# Patient Record
Sex: Female | Born: 1945 | Race: White | Hispanic: No | Smoking: Never smoker
Health system: Southern US, Community
[De-identification: ages and names within clinical notes are randomized; demographics above are authoritative.]

## PROBLEM LIST (undated history)

## (undated) DIAGNOSIS — E039 Hypothyroidism, unspecified: Secondary | ICD-10-CM

## (undated) DIAGNOSIS — I2699 Other pulmonary embolism without acute cor pulmonale: Secondary | ICD-10-CM

## (undated) DIAGNOSIS — Z3490 Encounter for supervision of normal pregnancy, unspecified, unspecified trimester: Secondary | ICD-10-CM

## (undated) DIAGNOSIS — K8020 Calculus of gallbladder without cholecystitis without obstruction: Secondary | ICD-10-CM

## (undated) DIAGNOSIS — E785 Hyperlipidemia, unspecified: Secondary | ICD-10-CM

## (undated) DIAGNOSIS — E119 Type 2 diabetes mellitus without complications: Secondary | ICD-10-CM

## (undated) DIAGNOSIS — R112 Nausea with vomiting, unspecified: Secondary | ICD-10-CM

## (undated) DIAGNOSIS — Z9889 Other specified postprocedural states: Secondary | ICD-10-CM

## (undated) DIAGNOSIS — I10 Essential (primary) hypertension: Secondary | ICD-10-CM

## (undated) DIAGNOSIS — J189 Pneumonia, unspecified organism: Secondary | ICD-10-CM

## (undated) HISTORY — PX: TUMOR EXCISION: SHX421

## (undated) HISTORY — PX: TUMOR REMOVAL: SHX12

## (undated) HISTORY — PX: COLONOSCOPY W/ POLYPECTOMY: SHX1380

## (undated) HISTORY — PX: SKIN CANCER EXCISION: SHX779

---

## 2004-04-26 ENCOUNTER — Other Ambulatory Visit: Admission: RE | Admit: 2004-04-26 | Discharge: 2004-04-26 | Payer: Self-pay | Admitting: Family Medicine

## 2004-08-08 ENCOUNTER — Encounter (INDEPENDENT_AMBULATORY_CARE_PROVIDER_SITE_OTHER): Payer: Self-pay | Admitting: *Deleted

## 2004-08-08 ENCOUNTER — Ambulatory Visit (HOSPITAL_COMMUNITY): Admission: RE | Admit: 2004-08-08 | Discharge: 2004-08-08 | Payer: Self-pay | Admitting: Gastroenterology

## 2006-11-15 ENCOUNTER — Emergency Department (HOSPITAL_COMMUNITY): Admission: EM | Admit: 2006-11-15 | Discharge: 2006-11-15 | Payer: Self-pay | Admitting: Emergency Medicine

## 2007-09-23 ENCOUNTER — Other Ambulatory Visit: Admission: RE | Admit: 2007-09-23 | Discharge: 2007-09-23 | Payer: Self-pay | Admitting: Family Medicine

## 2008-03-31 ENCOUNTER — Encounter: Admission: RE | Admit: 2008-03-31 | Discharge: 2008-03-31 | Payer: Self-pay | Admitting: Family Medicine

## 2008-06-01 ENCOUNTER — Ambulatory Visit (HOSPITAL_COMMUNITY): Admission: RE | Admit: 2008-06-01 | Discharge: 2008-06-01 | Payer: Self-pay | Admitting: Ophthalmology

## 2008-08-07 DIAGNOSIS — I2699 Other pulmonary embolism without acute cor pulmonale: Secondary | ICD-10-CM

## 2008-08-07 HISTORY — DX: Other pulmonary embolism without acute cor pulmonale: I26.99

## 2008-08-11 ENCOUNTER — Encounter (INDEPENDENT_AMBULATORY_CARE_PROVIDER_SITE_OTHER): Payer: Self-pay | Admitting: Internal Medicine

## 2008-08-11 ENCOUNTER — Inpatient Hospital Stay (HOSPITAL_COMMUNITY): Admission: EM | Admit: 2008-08-11 | Discharge: 2008-08-14 | Payer: Self-pay | Admitting: Emergency Medicine

## 2010-01-28 ENCOUNTER — Encounter: Payer: Self-pay | Admitting: Family Medicine

## 2010-04-15 LAB — BLOOD GAS, ARTERIAL
Bicarbonate: 26.3 mEq/L — ABNORMAL HIGH (ref 20.0–24.0)
O2 Saturation: 66.8 %
Patient temperature: 98.6
TCO2: 24.2 mmol/L (ref 0–100)
pH, Arterial: 7.422 — ABNORMAL HIGH (ref 7.350–7.400)
pO2, Arterial: 34.9 mmHg — CL (ref 80.0–100.0)

## 2010-04-15 LAB — DIFFERENTIAL
Basophils Absolute: 0 10*3/uL (ref 0.0–0.1)
Basophils Relative: 0 % (ref 0–1)
Eosinophils Absolute: 0 10*3/uL (ref 0.0–0.7)
Eosinophils Relative: 0 % (ref 0–5)
Lymphocytes Relative: 12 % (ref 12–46)
Lymphs Abs: 1 10*3/uL (ref 0.7–4.0)
Monocytes Absolute: 0.8 10*3/uL (ref 0.1–1.0)
Monocytes Relative: 10 % (ref 3–12)
Neutro Abs: 6.2 10*3/uL (ref 1.7–7.7)
Neutrophils Relative %: 77 % (ref 43–77)

## 2010-04-15 LAB — URINE CULTURE: Colony Count: >=100000

## 2010-04-15 LAB — HEMOGLOBIN A1C: Hgb A1c MFr Bld: 7.1 % — ABNORMAL HIGH (ref 4.6–6.1)

## 2010-04-15 LAB — BASIC METABOLIC PANEL
BUN: 15 mg/dL (ref 6–23)
CO2: 28 mEq/L (ref 19–32)
Calcium: 8.5 mg/dL (ref 8.4–10.5)
Calcium: 8.5 mg/dL (ref 8.4–10.5)
Chloride: 95 mEq/L — ABNORMAL LOW (ref 96–112)
Creatinine, Ser: 0.69 mg/dL (ref 0.4–1.2)
GFR calc Af Amer: >60 mL/min (ref >60)
GFR calc non Af Amer: 51 mL/min — ABNORMAL LOW (ref >60)
GFR calc non Af Amer: >60 mL/min (ref >60)
Glucose, Bld: 137 mg/dL — ABNORMAL HIGH (ref 70–99)
Sodium: 135 mEq/L (ref 135–145)

## 2010-04-15 LAB — GLUCOSE, CAPILLARY
Glucose-Capillary: 102 mg/dL — ABNORMAL HIGH (ref 70–99)
Glucose-Capillary: 141 mg/dL — ABNORMAL HIGH (ref 70–99)
Glucose-Capillary: 145 mg/dL — ABNORMAL HIGH (ref 70–99)
Glucose-Capillary: 149 mg/dL — ABNORMAL HIGH (ref 70–99)
Glucose-Capillary: 150 mg/dL — ABNORMAL HIGH (ref 70–99)
Glucose-Capillary: 156 mg/dL — ABNORMAL HIGH (ref 70–99)
Glucose-Capillary: 177 mg/dL — ABNORMAL HIGH (ref 70–99)
Glucose-Capillary: 187 mg/dL — ABNORMAL HIGH (ref 70–99)
Glucose-Capillary: 195 mg/dL — ABNORMAL HIGH (ref 70–99)
Glucose-Capillary: 211 mg/dL — ABNORMAL HIGH (ref 70–99)
Glucose-Capillary: 211 mg/dL — ABNORMAL HIGH (ref 70–99)
Glucose-Capillary: 220 mg/dL — ABNORMAL HIGH (ref 70–99)
Glucose-Capillary: 237 mg/dL — ABNORMAL HIGH (ref 70–99)

## 2010-04-15 LAB — POCT I-STAT, CHEM 8
BUN: 12 mg/dL (ref 6–23)
Calcium, Ion: 1.1 mmol/L — ABNORMAL LOW (ref 1.12–1.32)
Chloride: 95 meq/L — ABNORMAL LOW (ref 96–112)
Creatinine, Ser: 1.2 mg/dL (ref 0.4–1.2)
Glucose, Bld: 174 mg/dL — ABNORMAL HIGH (ref 70–99)
HCT: 36 % (ref 36.0–46.0)
Hemoglobin: 12.2 g/dL (ref 12.0–15.0)
Potassium: 3.8 meq/L (ref 3.5–5.1)
Sodium: 131 meq/L — ABNORMAL LOW (ref 135–145)
TCO2: 27 mmol/L (ref 0–100)

## 2010-04-15 LAB — PROTIME-INR
INR: 1.1 (ref 0.00–1.49)
INR: 1.2 (ref 0.00–1.49)
INR: 1.6 — ABNORMAL HIGH (ref 0.00–1.49)
Prothrombin Time: 13.2 seconds (ref 11.6–15.2)
Prothrombin Time: 14.3 seconds (ref 11.6–15.2)
Prothrombin Time: 15.4 seconds — ABNORMAL HIGH (ref 11.6–15.2)

## 2010-04-15 LAB — URINALYSIS, ROUTINE W REFLEX MICROSCOPIC
Ketones, ur: NEGATIVE mg/dL
Nitrite: NEGATIVE
Protein, ur: 30 mg/dL — AB
Specific Gravity, Urine: 1.02 (ref 1.005–1.030)

## 2010-04-15 LAB — TSH: TSH: 3.876 u[IU]/mL (ref 0.350–4.500)

## 2010-04-15 LAB — CBC
HCT: 32 % — ABNORMAL LOW (ref 36.0–46.0)
HCT: 36 % (ref 36.0–46.0)
Hemoglobin: 11.1 g/dL — ABNORMAL LOW (ref 12.0–15.0)
Hemoglobin: 12.2 g/dL (ref 12.0–15.0)
MCHC: 33.9 g/dL (ref 30.0–36.0)
MCHC: 34.6 g/dL (ref 30.0–36.0)
MCV: 91.8 fL (ref 78.0–100.0)
MCV: 92.8 fL (ref 78.0–100.0)
Platelets: 151 10*3/uL (ref 150–400)
Platelets: 181 10*3/uL (ref 150–400)
RBC: 3.48 MIL/uL — ABNORMAL LOW (ref 3.87–5.11)
RBC: 3.88 MIL/uL (ref 3.87–5.11)
RDW: 12.9 % (ref 11.5–15.5)
RDW: 13.1 % (ref 11.5–15.5)
WBC: 5.4 10*3/uL (ref 4.0–10.5)
WBC: 8.1 10*3/uL (ref 4.0–10.5)

## 2010-04-15 LAB — LACTIC ACID, PLASMA: Lactic Acid, Venous: 1.3 mmol/L (ref 0.5–2.2)

## 2010-04-15 LAB — COMPREHENSIVE METABOLIC PANEL
BUN: 11 mg/dL (ref 6–23)
Chloride: 94 mEq/L — ABNORMAL LOW (ref 96–112)
GFR calc Af Amer: >60 mL/min (ref >60)
Total Bilirubin: 0.9 mg/dL (ref 0.3–1.2)

## 2010-04-15 LAB — D-DIMER, QUANTITATIVE: D-Dimer, Quant: 9.81 ug/mL-FEU — ABNORMAL HIGH (ref 0.00–0.48)

## 2010-04-15 LAB — URINE MICROSCOPIC-ADD ON

## 2010-05-22 NOTE — Discharge Summary (Signed)
[name], [name]               ACCOUNT NO.:  [id number]   MEDICAL RECORD NO.:  [id number]          PATIENT TYPE:  INP   LOCATION:  1429                         FACILITY:  [facility]   PHYSICIAN:  Corinna L. Lendell Caprice, MDDATE OF BIRTH:  [birth year]   DATE OF ADMISSION:  08/09/2008  DATE OF DISCHARGE:  08/14/2008                               DISCHARGE SUMMARY   DIAGNOSES:  1. Bilateral pulmonary embolism with infarct in both lower lobes.  2. Transient episode of atrial fibrillation secondary to above.  3. Hypertension.  4. Type 2 diabetes.  5. Hyperlipidemia.  6. Hypothyroidism.  7. Group B strep urinary tract infection, asymptomatic.  8. Obesity.   DISCHARGE MEDICATIONS:  1. Coumadin 10 mg nightly or as directed by primary care physician.  2. Lovenox 120 mg subcutaneously twice a day for 6 doses or as      directed by primary care physician.  3. Stop aspirin while on Coumadin.  4. Cardizem CD 180 mg a day.  5. Stop Prinzide.  6. Change metformin to 1000 mg twice a day.   CONDITION ON DISCHARGE:  Stable.   ACTIVITY:  Ad lib.   FOLLOW UP:  With Dr. Corliss Blacker tomorrow for INR check at 9 a.m. call at  [phone].   DIET:  Diabetic Coumadin friendly.   CONSULTATIONS:  None.   PROCEDURES:  None.   LABORATORY STUDIES:  CBC unremarkable.  Comprehensive metabolic panel  significant for a sodium 131, glucose 174, otherwise unremarkable.  Hemoglobin A1c was 7.1, lactic acid 1.3, lipase 21, TSH 3.876, magnesium  2.3, UA showed negative nitrite, large leukocyte esterase, few squamous  epithelial cells, too numerous to count white cells, 11-20 red cells,  many bacteria.  Urine culture greater than 100,000 colonies of group B  strep.  INR on admission was 1.0.  At discharge is 1.6.   SPECIAL STUDIES:  Radiology, EKG on admission showed normal sinus  rhythm.  Subsequent EKG on the fifth, which showed atrial fibrillation  with rapid ventricular response with a rate about 120.  Two views  of the  chest on admission showed focal left basilar airspace opacification.  May reflect atelectasis or pneumonia.  CT of the abdomen and pelvis  showed suspicion for bilateral pulmonary emboli, left lower lobe  infiltrate, atelectasis and a left pleural effusion, minimal right  basilar atelectasis, and a tiny pleural effusion.  Mild thyromegaly.  Cholelithiasis.  CT angiogram of the chest showed bilateral pulmonary  emboli with infarct in both lobes.  Suspect right heart strain.  Small  bilateral pleural effusions, left greater than right.  Enlarged thyroid.  Echocardiogram showed an ejection fraction of 60%.  Mild mitral valvular  regurgitation.  Mildly dilated left atrium.  Trivial pericardial  effusion.  No mention of pulmonary artery pressure or right heart  strain.  Right ventricular systolic function appeared normal.   HOSPITAL COURSE:  [name] is a 65 year old white female patient of Dr.  Toniann Fail McNeill's who presented with left-sided abdominal pain.  Please  see history and physical for complete admission details.  She had no  shortness of breath.  She denied any dysuria but was found to have  evidence of urinary tract infection on urinalysis.  She had a CT of the  chest, abdomen, and pelvis which was suggestive of pulmonary emboli, but  this was not a CT angiogram.  A CT angiogram was done and did in fact  show pulmonary emboli.  She was started on Lovenox and Coumadin.  She  had a fever on admission of 101/7 and was started on IV Rocephin for  urinary tract infection.  Her metformin 2 days after CT angiogram was  increased to twice a day due to increased hemoglobin A1c.  She was given  Lovenox teaching and Coumadin was dosed with pharmacy.  She was rather  slow to become therapeutic, but she has no pain, no shortness of breath  at this time, and has been taught how to inject Lovenox.  She has been  able to ambulate.  She has been afebrile and feels back to baseline.   She  did have an episode which lasted several hours of atrial  fibrillation with rapid ventricular response.  She was briefly started  on a Cardizem drip and this was subsequently switched to oral Cardizem.  Her Prinzide was not resumed on admission and has not been needed.  For  now, I recommend continuing the Cardizem, but she may be able to start  back on Prinzide once her pulmonary emboli stabilize.  With respect to  risk factors, she did report being fairly immobile about a week prior to  admission with what she thought was the flu.  She was essentially  bedridden for 3 days.  In addition to that, she has had several long car  trips, and I suspect her pulmonary emboli are related to immobility.  She was started on Lovenox and, at this point, we have not run a  hypercoagulable workup, but this may be done once she has completed her  3 to 6 months of Coumadin if indicated, defer to primary care physician.  I have instructed the patient to continue the Lovenox and Coumadin, and  have instructed her to follow up with Dr. Corliss Blacker tomorrow for further  instructions.  She has 6 doses of Lovenox at this time.  I have also  called and left a message with Dr. Corliss Blacker.  Her office is closed, as it  is Sunday.  Total time on the day of discharge is 45 minutes.      Corinna L. Lendell Caprice, MD  Electronically Signed     CLS/MEDQ  D:  08/14/2008  T:  08/14/2008  Job:  [id number]   cc:   Pam Drown, M.D.  Fax: [phone]

## 2010-05-22 NOTE — Op Note (Signed)
Esparza, Sarah               ACCOUNT NO.:  [id number]   MEDICAL RECORD NO.:  [id number]          PATIENT TYPE:  AMB   LOCATION:  SDS                          FACILITY:  [facility]   PHYSICIAN:  Robert L. Dione Booze, M.D.  DATE OF BIRTH:  October 22, 1945   DATE OF PROCEDURE:  06/01/2008  DATE OF DISCHARGE:  06/01/2008                               OPERATIVE REPORT   INDICATIONS AND JUSTIFICATION FOR THE PROCEDURE:  [name] has been  followed and was seen recently on April 06, 2008, to be evaluated for  blepharoplasty.  She had noticed reduction of her superior vision.   Examination indeed did show significant dermatochalasis with the skin  covering the eyelashes.  The problem was documented with photographs and  visual field testing.  A letter was written to her insurance and  permission was received to perform upper eyelid blepharoplasties for  medical reasons.  Otherwise, the patient corrects to 20/30 in the right  eye and 20/20 in the left and pressures are 18 in the right and 19 in  the left.  The pupils, motility, eyelid, conjunctiva, cornea, anterior  chamber, and dilated fundus exam was negative, and she never had  diabetic retinopathy even though she is diabetic.  She decided to have  upper eyelid blepharoplasties after this was discussed with her.   JUSTIFICATION FOR PERFORMING PROCEDURE IN THE OUTPATIENT SETTING:  Routine.   JUSTIFICATION OVERNIGHT STAY:  None.   PREOPERATIVE DIAGNOSIS:  Severe dermatochalasis with facial impairment.   POSTOPERATIVE DIAGNOSIS:  Severe dermatochalasis with facial impairment.   OPERATION PERFORMED:  The patient arrived to the minor surgery room and  was prepped and draped in the routine fashion, and the skin of each  upper eyelid was anesthetized with 1% Xylocaine and epinephrine.  Skin  to be removed was demarcated and then excised along with some underlying  subcutaneous tissue and fatty tissue.  Bleeding was controlled with  pressure.   Each wound was closed with a running 6-0 nylon suture and  some Polysporin ointment was used on each wound.  Cold compresses were  applied.  The patient left the operating room having done nicely.   FOLLOWUP CARE:  She is to use cold compress today and warm compresses  starting tomorrow.  She is to keep her head elevated today.  She is to  control bleeding with pressure if this occurs.  She is to come to my  office in about over 6-8 to have her sutures removed.           ______________________________  Doris Cheadle Dione Booze, M.D.     RLG/MEDQ  D:  06/01/2008  T:  06/02/2008  Job:  [id number]   cc:   Dr. Uvaldo Rising

## 2010-05-22 NOTE — H&P (Signed)
[name], SESSLER               ACCOUNT NO.:  [id number]   MEDICAL RECORD NO.:  [id number]          PATIENT TYPE:  INP   LOCATION:  0113                         [facility]ACILITY:  [facility]   PHYSICIAN:  Hollice Espy, M.D.DATE OF BIRTH:  07-25-1945   DATE OF ADMISSION:  08/09/2008  DATE OF DISCHARGE:                              HISTORY & PHYSICAL   PRIMARY CARE Brookley Spitler:  Dr. Gweneth Dimitri.   CHIEF COMPLAINT:  Left upper quadrant abdominal pain.   HISTORY OF PRESENT ILLNESS:  The patient is a 65 year old white female  with past medical history of hypothyroidism, hypertension, and poorly  controlled diabetes mellitus who approximately 1 to 2 days ago started  having left upper quadrant abdominal pain, nonradiating but quite  severe.  She tried taking a leftover pain pill for this and had some  minimal relief, but then it kind of came back pretty severe.  She did  not really note any shortness of breath with this.  No chest pain, no  cough, no wheezing, no fevers, but then started feeling worse and so  came into the emergency room for further evaluation.  In the emergency  room, she is found to have a normal white count with a 77% neutrophils,  which is the high end of normal.  Glucose 175 and sodium 128.  The rest  of her labs were unremarkable, but then a urinalysis was obtained and  she was found to have a large urinary infection with too numerous to  count white cells and many bacteria.  An x-ray was done, which noted  some focal left basilar airspace opacification representing atelectasis  or pneumonia.  Because of the complaints of abdominal pain, and the  findings of a chest x-ray, a CT of the abdomen and chest together was  ordered.  This was not a dedicated CT angio of the chest, and the  findings are suspicious for bilateral pulmonary emboli showing a left  lower lobe infiltrate versus atelectasis, and a left pleural effusion.  The only other findings were some cholelithiasis,  and the bowel and  abdominal areas all look normal.  With these findings, it was felt best  that the patient come in.  Her oxygen saturations were normal on room  air.  When I saw the patient, she was doing well.  She had received some  medicine for pain.  Denied any headache, vision changes, dysphagia,  chest pain, palpitations, shortness of breath, wheezing, cough,  abdominal pain, hematuria, dysuria, constipation, diarrhea, focal  extremity numbness or weakness, or pain.   REVIEW OF SYSTEMS:  Otherwise, negative.   PAST MEDICAL HISTORY:  Includes obesity, poorly controlled diabetes  mellitus, hypertension, hypothyroidism, recent blepharoplasty.   MEDICATIONS:  1. The patient is on Synthroid 100.  2. Multivitamin.  3. Aspirin 81.  4. Metformin 1000 nightly.  5. Prinzide 10/12.5.  6. Zocor 40.   ALLERGIES:  No known drug allergies.   SOCIAL HISTORY:  No tobacco, alcohol, or drug use.   FAMILY HISTORY:  Noncontributory.   PHYSICAL EXAM:  VITAL SIGNS:  On admission, temperature 101.7, heart  rate 98, blood pressure 115/71, respirations 20, O2 saturation 94% on  room air.  GENERAL:  She is alert and oriented x3, in no apparent distress.  HEENT:  Normocephalic, atraumatic.  Mucous membranes are moist.  She has  no carotid bruits.  HEART:  Regular rate and rhythm.  S1, S2.  LUNGS:  She has decreased breath sounds at the bases, although some of  this mother be more from her body habitus.  ABDOMEN:  Soft, nontender.  Currently nondistended.  Hypoactive bowel  sounds.  EXTREMITIES:  No cyanosis, clubbing.  Trace pitting edema.   LABORATORY DATA:  White count 8.1, H and H 12.2 and 36, MCV 92, platelet  count 181,000, and 77% neutrophils.  High end of normal.  Lactic acid  level normal at 1.3.  Sodium 131, potassium 3.8, chloride 95, bicarb 28,  BUN 11, creatinine 1.03, glucose 175.  LFTs normal.  Lipase 21.  UA with  large leukocyte esterase, 30 protein, small hemoglobin with too  numerous  to count white cells and many bacteria, 11-20 red cells.  I have ordered  a TSH, hemoglobin A1c, an ABG and a D-dimer.   RADIOLOGIC STUDIES:  A 2-view of the chest notes a focal left basilar  airspace opacification.  May reflect atelectasis or pneumonia.  CT of  the abdomen and chest with contrast not a dedicated CT angiogram is  suspicious for bilateral pulmonary emboli involving the left lower lobe  pulmonary arteries, right greater than left.  Minimal right basilar  atelectasis.  Small pleural effusions.  Normal aorta.  Mild thyromegaly.  Cholelithiasis.  No cholecystitis.   ASSESSMENT AND PLAN:  1. Urinary tract infection.  Will treat with IV Rocephin, IV fluids.  2. Question pulmonary emboli.  I am not convinced given her lack of      symptoms whether or not this is truly pulmonary emboli.  We will go      ahead and check a D-dimer as well as an arterial blood gas looking      for signs of hypoxia and an elevated D-dimer.  If the D-dimer is      negative and she is not hypoxic, may need to do further testing,      such as a Doppler, before calling this a pulmonary embolism and      treating her with 6 months of Coumadin.  3. Obesity.  4. Diabetes mellitus, poorly controlled.  Check a hemoglobin A1c.      Sliding scale.  5. Hypertension.  6. Hypothyroidism.  No thyromegaly.  Check a TSH.      Hollice Espy, M.D.  Electronically Signed     SKK/MEDQ  D:  08/09/2008  T:  08/09/2008  Job:  [id number]   cc:   Pam Drown, M.D.  Fax: [phone]

## 2010-05-25 NOTE — Op Note (Signed)
[name], [name]               ACCOUNT NO.:  [id number]   MEDICAL RECORD NO.:  [id number]          PATIENT TYPE:  AMB   LOCATION:  ENDO                         FACILITY:  [facility]   PHYSICIAN:  Danise Edge, M.D.   DATE OF BIRTH:  28-Mar-1945   DATE OF PROCEDURE:  08/08/2004  DATE OF DISCHARGE:                                 OPERATIVE REPORT   PROCEDURE:  Screening colonoscopy.   PROCEDURE INDICATIONS:  [name] is a 65 year old female born [birth year].  [name] is scheduled to undergo her first screening colonoscopy  with polypectomy to prevent colon cancer.   ENDOSCOPIST:  Danise Edge, M.D.   PREMEDICATION:  Versed 5 mg, Demerol 50 mg.   DESCRIPTION OF PROCEDURE:  After obtaining informed consent, [name] was  placed in the left lateral decubitus position.  I administered intravenous  Demerol and intravenous Versed to achieve conscious sedation for the  procedure.  The patient's blood pressure, oxygen saturation and cardiac  rhythm were monitored throughout the procedure and documented in the medical  record.   Anal inspection and digital rectal exam were normal. The Olympus adjustable  pediatric colonoscope was introduced into the rectum and advanced to the  cecum. A normal-appearing ileocecal valve and appendiceal orifice were  identified. Colonic preparation for the exam today was excellent.   Rectum normal.  Retroflex view of the distal rectum normal.   Sigmoid colon and descending colon:  Left colonic diverticulosis. At 30 - 35  cm from the anal verge, two 1 mm sessile polyps were removed with cold  biopsy forceps.   Splenic flexure normal.   Transverse colon normal.   Hepatic flexure normal.   Descending colon normal.   Cecum and ileocecal valve normal.   ASSESSMENT:  1.  Left colonic diverticulosis.  2.  From the distal sigmoid colon two small polyps were removed with cold      biopsy forceps.       MJ/MEDQ  D:  08/08/2004  T:   08/08/2004  Job:  [id number]

## 2010-12-05 ENCOUNTER — Other Ambulatory Visit (HOSPITAL_COMMUNITY)
Admission: RE | Admit: 2010-12-05 | Discharge: 2010-12-05 | Disposition: A | Discharge status: Home / Self Care | Payer: Medicare Other | Source: Ambulatory Visit | Attending: Family Medicine | Admitting: Family Medicine

## 2010-12-05 ENCOUNTER — Other Ambulatory Visit: Payer: Self-pay | Admitting: Family Medicine

## 2010-12-05 DIAGNOSIS — Z124 Encounter for screening for malignant neoplasm of cervix: Secondary | ICD-10-CM | POA: Insufficient documentation

## 2011-09-20 ENCOUNTER — Other Ambulatory Visit: Payer: Self-pay | Admitting: Family Medicine

## 2011-09-20 DIAGNOSIS — E041 Nontoxic single thyroid nodule: Secondary | ICD-10-CM

## 2011-10-01 ENCOUNTER — Ambulatory Visit
Admission: RE | Admit: 2011-10-01 | Discharge: 2011-10-01 | Disposition: A | Discharge status: Home / Self Care | Payer: Medicare Other | Source: Ambulatory Visit | Attending: Family Medicine | Admitting: Family Medicine

## 2011-10-01 DIAGNOSIS — E041 Nontoxic single thyroid nodule: Secondary | ICD-10-CM

## 2013-06-21 ENCOUNTER — Other Ambulatory Visit: Payer: Self-pay | Admitting: Family Medicine

## 2013-06-21 ENCOUNTER — Other Ambulatory Visit (HOSPITAL_COMMUNITY)
Admission: RE | Admit: 2013-06-21 | Discharge: 2013-06-21 | Disposition: A | Discharge status: Home / Self Care | Payer: Medicare Other | Source: Ambulatory Visit | Attending: Family Medicine | Admitting: Family Medicine

## 2013-06-21 DIAGNOSIS — Z124 Encounter for screening for malignant neoplasm of cervix: Secondary | ICD-10-CM | POA: Insufficient documentation

## 2013-06-22 LAB — CYTOLOGY - PAP

## 2014-03-07 ENCOUNTER — Encounter (HOSPITAL_COMMUNITY): Payer: Self-pay | Admitting: Emergency Medicine

## 2014-03-07 ENCOUNTER — Emergency Department (HOSPITAL_COMMUNITY): Payer: Medicare Other

## 2014-03-07 ENCOUNTER — Inpatient Hospital Stay (HOSPITAL_COMMUNITY)
Admission: EM | Admit: 2014-03-07 | Discharge: 2014-03-15 | DRG: 871 | Disposition: A | Discharge status: Home Health | Payer: Medicare Other | Attending: Internal Medicine | Admitting: Internal Medicine

## 2014-03-07 DIAGNOSIS — N179 Acute kidney failure, unspecified: Secondary | ICD-10-CM | POA: Diagnosis present

## 2014-03-07 DIAGNOSIS — D638 Anemia in other chronic diseases classified elsewhere: Secondary | ICD-10-CM | POA: Diagnosis present

## 2014-03-07 DIAGNOSIS — R0602 Shortness of breath: Secondary | ICD-10-CM | POA: Diagnosis present

## 2014-03-07 DIAGNOSIS — A419 Sepsis, unspecified organism: Principal | ICD-10-CM | POA: Diagnosis present

## 2014-03-07 DIAGNOSIS — I82431 Acute embolism and thrombosis of right popliteal vein: Secondary | ICD-10-CM | POA: Diagnosis present

## 2014-03-07 DIAGNOSIS — D696 Thrombocytopenia, unspecified: Secondary | ICD-10-CM | POA: Diagnosis present

## 2014-03-07 DIAGNOSIS — I82441 Acute embolism and thrombosis of right tibial vein: Secondary | ICD-10-CM | POA: Diagnosis present

## 2014-03-07 DIAGNOSIS — I82409 Acute embolism and thrombosis of unspecified deep veins of unspecified lower extremity: Secondary | ICD-10-CM | POA: Diagnosis present

## 2014-03-07 DIAGNOSIS — Z86711 Personal history of pulmonary embolism: Secondary | ICD-10-CM

## 2014-03-07 DIAGNOSIS — E785 Hyperlipidemia, unspecified: Secondary | ICD-10-CM | POA: Diagnosis present

## 2014-03-07 DIAGNOSIS — N390 Urinary tract infection, site not specified: Secondary | ICD-10-CM | POA: Diagnosis present

## 2014-03-07 DIAGNOSIS — Z6838 Body mass index (BMI) 38.0-38.9, adult: Secondary | ICD-10-CM

## 2014-03-07 DIAGNOSIS — E875 Hyperkalemia: Secondary | ICD-10-CM | POA: Diagnosis present

## 2014-03-07 DIAGNOSIS — E1165 Type 2 diabetes mellitus with hyperglycemia: Secondary | ICD-10-CM | POA: Diagnosis present

## 2014-03-07 DIAGNOSIS — J9601 Acute respiratory failure with hypoxia: Secondary | ICD-10-CM | POA: Diagnosis present

## 2014-03-07 DIAGNOSIS — E860 Dehydration: Secondary | ICD-10-CM | POA: Diagnosis present

## 2014-03-07 DIAGNOSIS — K851 Biliary acute pancreatitis: Secondary | ICD-10-CM | POA: Diagnosis present

## 2014-03-07 DIAGNOSIS — K8020 Calculus of gallbladder without cholecystitis without obstruction: Secondary | ICD-10-CM

## 2014-03-07 DIAGNOSIS — J189 Pneumonia, unspecified organism: Secondary | ICD-10-CM | POA: Clinically undetermined

## 2014-03-07 DIAGNOSIS — IMO0002 Reserved for concepts with insufficient information to code with codable children: Secondary | ICD-10-CM | POA: Diagnosis present

## 2014-03-07 DIAGNOSIS — K859 Acute pancreatitis without necrosis or infection, unspecified: Secondary | ICD-10-CM | POA: Insufficient documentation

## 2014-03-07 DIAGNOSIS — I2692 Saddle embolus of pulmonary artery without acute cor pulmonale: Secondary | ICD-10-CM | POA: Diagnosis present

## 2014-03-07 DIAGNOSIS — E039 Hypothyroidism, unspecified: Secondary | ICD-10-CM | POA: Diagnosis present

## 2014-03-07 DIAGNOSIS — N182 Chronic kidney disease, stage 2 (mild): Secondary | ICD-10-CM | POA: Diagnosis present

## 2014-03-07 DIAGNOSIS — R0600 Dyspnea, unspecified: Secondary | ICD-10-CM | POA: Diagnosis present

## 2014-03-07 DIAGNOSIS — E1122 Type 2 diabetes mellitus with diabetic chronic kidney disease: Secondary | ICD-10-CM | POA: Diagnosis present

## 2014-03-07 DIAGNOSIS — I4891 Unspecified atrial fibrillation: Secondary | ICD-10-CM | POA: Diagnosis present

## 2014-03-07 DIAGNOSIS — E872 Acidosis, unspecified: Secondary | ICD-10-CM | POA: Diagnosis present

## 2014-03-07 DIAGNOSIS — I2699 Other pulmonary embolism without acute cor pulmonale: Secondary | ICD-10-CM | POA: Diagnosis present

## 2014-03-07 DIAGNOSIS — I82411 Acute embolism and thrombosis of right femoral vein: Secondary | ICD-10-CM | POA: Diagnosis present

## 2014-03-07 DIAGNOSIS — R1013 Epigastric pain: Secondary | ICD-10-CM

## 2014-03-07 DIAGNOSIS — I129 Hypertensive chronic kidney disease with stage 1 through stage 4 chronic kidney disease, or unspecified chronic kidney disease: Secondary | ICD-10-CM | POA: Diagnosis present

## 2014-03-07 DIAGNOSIS — N289 Disorder of kidney and ureter, unspecified: Secondary | ICD-10-CM

## 2014-03-07 DIAGNOSIS — E871 Hypo-osmolality and hyponatremia: Secondary | ICD-10-CM | POA: Diagnosis present

## 2014-03-07 DIAGNOSIS — R739 Hyperglycemia, unspecified: Secondary | ICD-10-CM | POA: Diagnosis present

## 2014-03-07 DIAGNOSIS — I10 Essential (primary) hypertension: Secondary | ICD-10-CM | POA: Diagnosis present

## 2014-03-07 HISTORY — DX: Encounter for supervision of normal pregnancy, unspecified, unspecified trimester: Z34.90

## 2014-03-07 HISTORY — DX: Other pulmonary embolism without acute cor pulmonale: I26.99

## 2014-03-07 HISTORY — DX: Calculus of gallbladder without cholecystitis without obstruction: K80.20

## 2014-03-07 HISTORY — DX: Type 2 diabetes mellitus without complications: E11.9

## 2014-03-07 HISTORY — DX: Hypothyroidism, unspecified: E03.9

## 2014-03-07 HISTORY — DX: Hyperlipidemia, unspecified: E78.5

## 2014-03-07 HISTORY — DX: Essential (primary) hypertension: I10

## 2014-03-07 LAB — BASIC METABOLIC PANEL
Anion gap: 15 (ref 5–15)
BUN: 28 mg/dL — AB (ref 6–23)
CALCIUM: 8.8 mg/dL (ref 8.4–10.5)
CO2: 16 mmol/L — ABNORMAL LOW (ref 19–32)
CREATININE: 1.56 mg/dL — AB (ref 0.50–1.10)
Chloride: 98 mmol/L (ref 96–112)
GFR, EST AFRICAN AMERICAN: 38 mL/min — AB (ref >90)
GFR, EST NON AFRICAN AMERICAN: 33 mL/min — AB (ref >90)
Glucose, Bld: 555 mg/dL (ref 70–99)
Potassium: 5.5 mmol/L — ABNORMAL HIGH (ref 3.5–5.1)
Sodium: 129 mmol/L — ABNORMAL LOW (ref 135–145)

## 2014-03-07 LAB — CBC
HEMATOCRIT: 42.4 % (ref 36.0–46.0)
Hemoglobin: 13.7 g/dL (ref 12.0–15.0)
MCH: 30.5 pg (ref 26.0–34.0)
MCHC: 32.3 g/dL (ref 30.0–36.0)
MCV: 94.4 fL (ref 78.0–100.0)
Platelets: 184 10*3/uL (ref 150–400)
RBC: 4.49 MIL/uL (ref 3.87–5.11)
RDW: 13.8 % (ref 11.5–15.5)
WBC: 8.7 10*3/uL (ref 4.0–10.5)

## 2014-03-07 LAB — D-DIMER, QUANTITATIVE (NOT AT ARMC)

## 2014-03-07 LAB — BLOOD GAS, ARTERIAL
ACID-BASE DEFICIT: 10.6 mmol/L — AB (ref 0.0–2.0)
BICARBONATE: 12.6 meq/L — AB (ref 20.0–24.0)
Drawn by: 308601
FIO2: 0.21 %
O2 SAT: 92.2 %
PATIENT TEMPERATURE: 97.3
PO2 ART: 65.5 mmHg — AB (ref 80.0–100.0)
TCO2: 11.3 mmol/L (ref 0–100)
pCO2 arterial: 21.4 mmHg — ABNORMAL LOW (ref 35.0–45.0)
pH, Arterial: 7.385 (ref 7.350–7.450)

## 2014-03-07 LAB — APTT: aPTT: 27 seconds (ref 24–37)

## 2014-03-07 LAB — I-STAT TROPONIN, ED: Troponin i, poc: 0.06 ng/mL (ref 0.00–0.08)

## 2014-03-07 LAB — PROTIME-INR
INR: 1.3 (ref 0.00–1.49)
Prothrombin Time: 16.3 seconds — ABNORMAL HIGH (ref 11.6–15.2)

## 2014-03-07 LAB — CBG MONITORING, ED: Glucose-Capillary: 547 mg/dL — ABNORMAL HIGH (ref 70–99)

## 2014-03-07 MED ORDER — DILTIAZEM HCL ER COATED BEADS 180 MG PO CP24
180.0000 mg | ORAL_CAPSULE | Freq: Every day | ORAL | Status: DC
  Administered 2014-03-08 – 2014-03-15 (×8): 180 mg via ORAL
  Filled 2014-03-07 (×8): qty 1

## 2014-03-07 MED ORDER — DEXTROSE-NACL 5-0.45 % IV SOLN
INTRAVENOUS | Status: DC
  Administered 2014-03-08: 08:00:00 via INTRAVENOUS

## 2014-03-07 MED ORDER — SODIUM CHLORIDE 0.9 % IV BOLUS (SEPSIS)
1000.0000 mL | Freq: Once | INTRAVENOUS | Status: AC
  Administered 2014-03-08: 1000 mL via INTRAVENOUS

## 2014-03-07 MED ORDER — OXYCODONE HCL 5 MG PO TABS
5.0000 mg | ORAL_TABLET | ORAL | Status: DC | PRN
  Administered 2014-03-11: 5 mg via ORAL
  Filled 2014-03-07: qty 1

## 2014-03-07 MED ORDER — SODIUM CHLORIDE 0.9 % IV BOLUS (SEPSIS)
1000.0000 mL | Freq: Once | INTRAVENOUS | Status: AC
  Administered 2014-03-07: 1000 mL via INTRAVENOUS

## 2014-03-07 MED ORDER — INSULIN REGULAR HUMAN 100 UNIT/ML IJ SOLN
INTRAMUSCULAR | Status: DC
  Administered 2014-03-08: 3.7 [IU]/h via INTRAVENOUS
  Filled 2014-03-07: qty 2.5

## 2014-03-07 MED ORDER — LEVOTHYROXINE SODIUM 112 MCG PO TABS
112.0000 ug | ORAL_TABLET | Freq: Every day | ORAL | Status: DC
  Administered 2014-03-08 – 2014-03-15 (×9): 112 ug via ORAL
  Filled 2014-03-07 (×10): qty 1

## 2014-03-07 MED ORDER — HYDRALAZINE HCL 20 MG/ML IJ SOLN: 5.0000 mg | INTRAMUSCULAR | Status: DC | PRN

## 2014-03-07 MED ORDER — ACETAMINOPHEN 650 MG RE SUPP: 650.0000 mg | Freq: Four times a day (QID) | RECTAL | Status: DC | PRN

## 2014-03-07 MED ORDER — ATORVASTATIN CALCIUM 20 MG PO TABS
20.0000 mg | ORAL_TABLET | Freq: Every day | ORAL | Status: DC
  Administered 2014-03-08 – 2014-03-14 (×7): 20 mg via ORAL
  Filled 2014-03-07 (×7): qty 1
  Filled 2014-03-07: qty 2

## 2014-03-07 MED ORDER — SODIUM CHLORIDE 0.9 % IV SOLN
INTRAVENOUS | Status: AC
  Administered 2014-03-07: 23:00:00 via INTRAVENOUS

## 2014-03-07 MED ORDER — ACETAMINOPHEN 325 MG PO TABS
650.0000 mg | ORAL_TABLET | Freq: Four times a day (QID) | ORAL | Status: DC | PRN
  Administered 2014-03-10 – 2014-03-12 (×2): 650 mg via ORAL
  Filled 2014-03-07 (×2): qty 2

## 2014-03-07 MED ORDER — HEPARIN (PORCINE) IN NACL 100-0.45 UNIT/ML-% IJ SOLN
1500.0000 [IU]/h | INTRAMUSCULAR | Status: DC
  Administered 2014-03-08 – 2014-03-09 (×2): 1500 [IU]/h via INTRAVENOUS
  Filled 2014-03-07 (×5): qty 250

## 2014-03-07 MED ORDER — SODIUM CHLORIDE 0.9 % IV SOLN
INTRAVENOUS | Status: DC
  Administered 2014-03-07: 23:00:00 via INTRAVENOUS

## 2014-03-07 MED ORDER — INSULIN ASPART 100 UNIT/ML ~~LOC~~ SOLN
15.0000 [IU] | Freq: Once | SUBCUTANEOUS | Status: AC
  Administered 2014-03-08: 15 [IU] via SUBCUTANEOUS

## 2014-03-07 MED ORDER — SODIUM CHLORIDE 0.9 % IJ SOLN
3.0000 mL | Freq: Two times a day (BID) | INTRAMUSCULAR | Status: DC
  Administered 2014-03-07 – 2014-03-11 (×5): 3 mL via INTRAVENOUS

## 2014-03-07 MED ORDER — ENOXAPARIN SODIUM 120 MG/0.8ML ~~LOC~~ SOLN
115.0000 mg | Freq: Once | SUBCUTANEOUS | Status: DC
  Filled 2014-03-07: qty 0.8

## 2014-03-07 MED ORDER — HEPARIN BOLUS VIA INFUSION
2500.0000 [IU] | Freq: Once | INTRAVENOUS | Status: AC
  Administered 2014-03-07: 2500 [IU] via INTRAVENOUS
  Filled 2014-03-07: qty 2500

## 2014-03-07 NOTE — Progress Notes (Signed)
ANTICOAGULATION CONSULT NOTE - Initial Consult  Pharmacy Consult for Heparin Indication: Rule out PE  No Known Allergies  Patient Measurements: Height: 5\' 10"  (177.8 cm) Weight: 250 lb (113.399 kg) IBW/kg (Calculated) : 68.5 Heparin Dosing Weight: 93.8 kg  Vital Signs: Temp: 97.6 F (36.4 C) (02/29 2009) Temp Source: Oral (02/29 2009) BP: 131/64 mmHg (02/29 2100) Pulse Rate: 89 (02/29 2115)  Labs:  Recent Labs  03/07/14 1829  HGB 13.7  HCT 42.4  PLT 184  CREATININE 1.56*    Estimated Creatinine Clearance: 46.5 mL/min (by C-G formula based on Cr of 1.56).   Medical History: Past Medical History  Diagnosis Date  . Pregnancy   . PE (pulmonary embolism)     Medications:   (Not in a hospital admission)  Assessment: 69 yo female who presents with increasing shortness of breath and RLE swelling over the past 7 days. She has history of PE several years ago for which she was previously on coumadin for 6 months. D-dime >20, currently unable to receive IV contrast due to renal function. Pharmacy is consulted to dose IV heparin for presumed PE.  Goal of Therapy:  Heparin level 0.3-0.7 units/ml Monitor platelets by anticoagulation protocol: Yes   Plan:   Heparin 2500 units IV bolus  Heparin IV infusion 1500 units/hr  Check heparin level in 8 hrs  Daily heparin level and CBC  Loralee PacasErin Gaige Sebo, PharmD, BCPS Pager: [phone] 03/07/2014,9:26 PM

## 2014-03-07 NOTE — ED Notes (Signed)
Pt with Hx of PE c/o SOB and dizziness onset 10 days ago after left leg felt heavy and stiff. Patient becomes short of breath easily, with talking alone. BP 167/125

## 2014-03-07 NOTE — H&P (Signed)
[facility] History and Physical  PAULENE TAYAG ONG:[id number] DOB: [birth year] DOA: 03/07/2014  Referring physician: Dr Floyce Stakes PCP: Gweneth Dimitri, MD   Chief Complaint: SOB and weakness  HPI: Sarah BUCZEK is a 69 y.o. female  RLE swelling. sarted 10 days ago. Placed compression stockings w/ improvement. 6 days ago developed acute SOB. Constant. Getting worse. Rest w/o improvement. Associated with dizziness and weakness. Denies cough, CP, palpitations, fevers unexplained wt loss, night sweats.  Checks CBG at home about every 2 wks ago. Last check 1 wk ago and was 220  Review of Systems:  Constitutional:  No weight loss, night sweats, Fevers, chills, fatigue.  HEENT:  No headaches, Difficulty swallowing,Tooth/dental problems,Sore throat,  No sneezing, itching, ear ache, nasal congestion, post nasal drip,  Cardio-vascular:  No chest pain, Orthopnea, PND, anasarca, dizziness, palpitations  GI:  No heartburn, indigestion, abdominal pain, nausea, vomiting, diarrhea, change in bowel habits, loss of appetite  Resp: Per HPI Skin:  no rash or lesions.  GU:  no dysuria, change in color of urine, no urgency or frequency. No flank pain.  Musculoskeletal:   No joint pain or swelling. No decreased range of motion. No back pain.  Psych:  No change in mood or affect. No depression or anxiety. No memory loss.   Past Medical History  Diagnosis Date  . Pregnancy   . PE (pulmonary embolism) 8 2010  . Diabetes   . Hypothyroid   . HLD (hyperlipidemia)   . Hypertension    History reviewed. No pertinent past surgical history. Social History:  reports that she has never smoked. She does not have any smokeless tobacco history on file. She reports that she drinks alcohol. She reports that she uses illicit drugs.  No Known Allergies  Family History  Problem Relation Age of Onset  . Heart failure Mother   . Heart failure Father      Prior to Admission medications     Medication Sig Start Date End Date Taking? Authorizing Provider  atorvastatin (LIPITOR) 20 MG tablet Take 20 mg by mouth daily.   Yes Historical Provider, MD  diltiazem (CARDIZEM CD) 180 MG 24 hr capsule Take 1 capsule by mouth daily. 02/09/14  Yes Historical Provider, MD  glimepiride (AMARYL) 4 MG tablet Take 4 mg by mouth 2 (two) times daily.   Yes Historical Provider, MD  levothyroxine (SYNTHROID, LEVOTHROID) 112 MCG tablet Take 112 mcg by mouth daily before breakfast.   Yes Historical Provider, MD  lisinopril (PRINIVIL,ZESTRIL) 20 MG tablet Take 20 mg by mouth daily.   Yes Historical Provider, MD  metFORMIN (GLUCOPHAGE) 1000 MG tablet Take 1,000 mg by mouth 2 (two) times daily with a meal.   Yes Historical Provider, MD  Multiple Vitamins-Minerals (MULTIVITAMIN WITH MINERALS) tablet Take 1 tablet by mouth daily.   Yes Historical Provider, MD   Physical Exam: Filed Vitals:   03/07/14 2100 03/07/14 2115 03/07/14 2130 03/07/14 2200  BP: 131/64  102/74 108/87  Pulse: 88 89 89   Temp:      TempSrc:      Resp: Height:      Weight:      SpO2: 94% 94% 100%     Wt Readings from Last 3 Encounters:  03/07/14 113.399 kg (250 lb)    General:  Appears to be in mild to moderate distress Eyes:  PERRL, normal lids, irises & conjunctiva ENT: Dry Mucus membranes Neck:  no LAD, masses or thyromegaly Cardiovascular:  RRR, no m/r/g. Trace LLE edema nd 2+ RLE pitting edema Telemetry:  SR, no arrhythmias  Respiratory:  CTA bilaterally, no w/r/r. Normal respiratory effort. Abdomen:  soft, ntnd Skin:  no rash or induration seen on limited exam Musculoskeletal:  grossly normal tone BUE/BLE Psychiatric:  grossly normal mood and affect, speech fluent and appropriate Neurologic:  grossly non-focal.          Labs on Admission:  Basic Metabolic Panel:  Recent Labs Lab 03/07/14 1829  NA 129*  K 5.5*  CL 98  CO2 16*  GLUCOSE 555*  BUN 28*  CREATININE 1.56*  CALCIUM 8.8   Liver  Function Tests: No results for input(s): AST, ALT, ALKPHOS, BILITOT, PROT, ALBUMIN in the last 168 hours. No results for input(s): LIPASE, AMYLASE in the last 168 hours. No results for input(s): AMMONIA in the last 168 hours. CBC:  Recent Labs Lab 03/07/14 1829  WBC 8.7  HGB 13.7  HCT 42.4  MCV 94.4  PLT 184   Cardiac Enzymes: No results for input(s): CKTOTAL, CKMB, CKMBINDEX, TROPONINI in the last 168 hours.  BNP (last 3 results) No results for input(s): BNP in the last 8760 hours.  ProBNP (last 3 results) No results for input(s): PROBNP in the last 8760 hours.  CBG:  Recent Labs Lab 03/07/14 1945  GLUCAP 547*    Radiological Exams on Admission: Dg Chest Port 1 View  03/07/2014   CLINICAL DATA:  Acute onset of cough and chills. Shortness of breath. Initial encounter.  EXAM: PORTABLE CHEST - 1 VIEW  COMPARISON:  Chest radiograph performed 08/09/2008, and CTA of the chest from 08/10/2008  FINDINGS: The lungs are well-aerated and clear. There is no evidence of focal opacification, pleural effusion or pneumothorax.  The cardiomediastinal silhouette is borderline normal in size. No acute osseous abnormalities are seen. Scattered clips are seen overlying the right axilla.  IMPRESSION: No acute cardiopulmonary process seen.   Electronically Signed   By: Roanna Raider M.D.   On: 03/07/2014 19:00    EKG: SInus, R axis deviation, inferiorlateral T wave changes  Assessment/Plan Principal Problem:   Acute pulmonary embolism Active Problems:   Dyspnea   SOB (shortness of breath)   Metabolic acidosis   Hyperglycemia   Diabetes mellitus out of control   AKI (acute kidney injury)   Hyperkalemia   Hyponatremia   HLD (hyperlipidemia)   Essential hypertension   Hypothyroidism  SOB: likely secondary to PE. Previous history of PE in 2010. NO clear etiology for current PE. d-dimer greater than 20. CTA not performed secondary to to acute kidney injury.EKG concerning for right heart  strain. Patient having to speak in short sentences. ABG pH 7.38 CO2 21.4 O2 65.5 bicarbonate 12.6. No evidence of pneumonia or other respiratory etiology of symptoms. Troponin neg. - admit - stepdown - Heparin drip - CTA in the morning after renal function improves ( considered VQ scan but will wait for CTA in the morning as this will change management at this time.) -  Lower extremity venous duplex - O2 when necessary - Start long term anticoagulation prior to DC after coming off heparin drip (pt aware that this will be for life)  Metabolic Acidosis / Diabetes: Respiratory compensation in decompensated respiratory state. Anion gap nml at 15, CO2 12. Ketones not measured w/ initial labs. Last A1c 7.1 (2010). On metformin and Glimeperide only. Glucose 555 on admission.  - Hold home metformin and glimepiride.   - Glucose stabilizer - UA - A1c -  Mag  Acute kidney injury: creatinine1.56 baseline1.0. - IV fluids as above - BMET in the a.m.  Hyperkalemia: potassium 5.5. - IV hydration - Insulin and glucose as above - monitor closely  HLD:  continue lipitor  Hypothyroid: - continue syntrhoid  HTN: nml to elevated on admission - continue home Cardizem - hold lisinopril due to AKI - Hydralazine PRN SBP >180     Code Status: FULL DVT Prophylaxis: Heparin Drip Family Communication: None Disposition Plan: pending improvement    Jolea Dolle Shela CommonsJ, MD Family Medicine [facility] [email or web address] Password [email or web address]

## 2014-03-07 NOTE — ED Provider Notes (Signed)
CSN: [id number]     Arrival date & time 03/07/14  1733 History   First MD Initiated Contact with Patient 03/07/14 1834     Chief Complaint  Patient presents with  . Shortness of Breath  . Dizziness     HPI Patient ports increasing shortness of breath over the past 7 days.  She also reports fatigue and weakness.  She states she has no energy.  She has a history of pulmonary embolism for which she was previously on Coumadin.  She was only on Coumadin for 6 months.  This was several years ago.  She reports worsening right lower extremity swelling over the past week as well.  She denies a history of anemia.  Her husband reports that her color is normal for her.  She feels short of breath when she talks and walks extremely short distances.  Does also report orthopnea.  No history of coronary artery disease or congestive heart failure.  She denies nausea vomiting and diarrhea.  She is a diabetic.  She's been compliant with her medications.  She reports polyuria and polydipsia.   Past Medical History  Diagnosis Date  . Pregnancy   . PE (pulmonary embolism)    History reviewed. No pertinent past surgical history. History reviewed. No pertinent family history. History  Substance Use Topics  . Smoking status: Never Smoker   . Smokeless tobacco: Not on file  . Alcohol Use: No   OB History    No data available     Review of Systems  All other systems reviewed and are negative.     Allergies  Review of patient's allergies indicates no known allergies.  Home Medications   Prior to Admission medications   Medication Sig Start Date End Date Taking? Authorizing Provider  atorvastatin (LIPITOR) 20 MG tablet Take 20 mg by mouth daily.   Yes Historical Provider, MD  diltiazem (CARDIZEM CD) 180 MG 24 hr capsule Take 1 capsule by mouth daily. 02/09/14  Yes Historical Provider, MD  glimepiride (AMARYL) 4 MG tablet Take 4 mg by mouth 2 (two) times daily.   Yes Historical Provider, MD   levothyroxine (SYNTHROID, LEVOTHROID) 112 MCG tablet Take 112 mcg by mouth daily before breakfast.   Yes Historical Provider, MD  lisinopril (PRINIVIL,ZESTRIL) 20 MG tablet Take 20 mg by mouth daily.   Yes Historical Provider, MD  metFORMIN (GLUCOPHAGE) 1000 MG tablet Take 1,000 mg by mouth 2 (two) times daily with a meal.   Yes Historical Provider, MD  Multiple Vitamins-Minerals (MULTIVITAMIN WITH MINERALS) tablet Take 1 tablet by mouth daily.   Yes Historical Provider, MD   BP 140/72 mmHg  Pulse 96  Temp(Src) 97.3 F (36.3 C) (Oral)  Resp 22  SpO2 97% Physical Exam  Constitutional: She is oriented to person, place, and time. She appears well-developed and well-nourished. No distress.  HENT:  Head: Normocephalic and atraumatic.  Eyes: EOM are normal.  Neck: Normal range of motion.  Cardiovascular: Normal rate, regular rhythm and normal heart sounds.   Pulmonary/Chest: Effort normal and breath sounds normal. She has no wheezes.  Abdominal: Soft. She exhibits no distension. There is no tenderness.  Musculoskeletal: Normal range of motion.  Neurological: She is alert and oriented to person, place, and time.  Skin: Skin is warm and dry.  Psychiatric: She has a normal mood and affect. Judgment normal.  Nursing note and vitals reviewed.   ED Course  Procedures (including critical care time)  CRITICAL CARE Performed by: Lyanne Co Total  critical care time: 32 Critical care time was exclusive of separately billable procedures and treating other patients. Critical care was necessary to treat or prevent imminent or life-threatening deterioration. Critical care was time spent personally by me on the following activities: development of treatment plan with patient and/or surrogate as well as nursing, discussions with consultants, evaluation of patient's response to treatment, examination of patient, obtaining history from patient or surrogate, ordering and performing treatments and  interventions, ordering and review of laboratory studies, ordering and review of radiographic studies, pulse oximetry and re-evaluation of patient's condition.  Labs Review Labs Reviewed  BASIC METABOLIC PANEL - Abnormal; Notable for the following:    Sodium 129 (*)    Potassium 5.5 (*)    CO2 16 (*)    Glucose, Bld 555 (*)    BUN 28 (*)    Creatinine, Ser 1.56 (*)    GFR calc non Af Amer 33 (*)    GFR calc Af Amer 38 (*)    All other components within normal limits  D-DIMER, QUANTITATIVE - Abnormal; Notable for the following:    D-Dimer, Quant >20.00 (*)    All other components within normal limits  BLOOD GAS, ARTERIAL - Abnormal; Notable for the following:    pCO2 arterial 21.4 (*)    pO2, Arterial 65.5 (*)    Bicarbonate 12.6 (*)    Acid-base deficit 10.6 (*)    All other components within normal limits  CBG MONITORING, ED - Abnormal; Notable for the following:    Glucose-Capillary 547 (*)    All other components within normal limits  CBC  BRAIN NATRIURETIC PEPTIDE  APTT  PROTIME-INR  Rosezena Sensor, ED    Imaging Review Dg Chest Port 1 View  03/07/2014   CLINICAL DATA:  Acute onset of cough and chills. Shortness of breath. Initial encounter.  EXAM: PORTABLE CHEST - 1 VIEW  COMPARISON:  Chest radiograph performed 08/09/2008, and CTA of the chest from 08/10/2008  FINDINGS: The lungs are well-aerated and clear. There is no evidence of focal opacification, pleural effusion or pneumothorax.  The cardiomediastinal silhouette is borderline normal in size. No acute osseous abnormalities are seen. Scattered clips are seen overlying the right axilla.  IMPRESSION: No acute cardiopulmonary process seen.   Electronically Signed   By: Roanna Raider M.D.   On: 03/07/2014 19:00  I personally reviewed the imaging tests through PACS system I reviewed available ER/hospitalization records through the EMR     EKG Interpretation   Date/Time:  Monday March 07 2014 18:28:11  EST Ventricular Rate:  97 PR Interval:  181 QRS Duration: 99 QT Interval:  375 QTC Calculation: 476 R Axis:   132 Text Interpretation:  Sinus rhythm Right axis deviation Low voltage,  precordial leads Anteroseptal infarct, old Abnormal T, consider ischemia,  diffuse leads inferior lateral t waves changed from prior ecg Confirmed by  Chinmayi Rumer  MD, Caryn Bee ([id number]) on 03/07/2014 6:35:19 PM      MDM   Final diagnoses:  None    Suspect large pulmonary embolism at this time given her right lower extremity swelling and shortness of breath.  D-dimer greater than 20.  Patient will be started on heparin at this time.  Unable to receive IV contrast secondary to renal insufficiency.  This is likely secondary to hyperglycemia and dehydration.  Patient be hydrated overnight and in the morning she likely will be able to undergo CT angiogram to further evaluate her clot burden.  I do not have an alternative  diagnosis at this time.  Unable to obtain duplex ultrasound this time as well.  This will be available in the morning at which point she will need bilateral lower extremity duplexes.    Lyanne CoKevin M Noel Rodier, MD 03/07/14 2130

## 2014-03-07 NOTE — Progress Notes (Signed)
EDCM spoke to patient at bedside.  Patient confirms her pcp is [facility].  System updated.

## 2014-03-07 NOTE — ED Notes (Signed)
Heparin 2500 units bolus completed.

## 2014-03-07 NOTE — ED Notes (Signed)
CBG 547 

## 2014-03-07 NOTE — ED Notes (Signed)
Bed: WA06 Expected date:  Expected time:  Means of arrival:  Comments: TR1

## 2014-03-07 NOTE — ED Notes (Signed)
Lab was called for add-on lab: APTT and Pro-time-INR.

## 2014-03-08 DIAGNOSIS — R0600 Dyspnea, unspecified: Secondary | ICD-10-CM

## 2014-03-08 DIAGNOSIS — I2699 Other pulmonary embolism without acute cor pulmonale: Secondary | ICD-10-CM

## 2014-03-08 DIAGNOSIS — I82401 Acute embolism and thrombosis of unspecified deep veins of right lower extremity: Secondary | ICD-10-CM

## 2014-03-08 LAB — URINALYSIS, ROUTINE W REFLEX MICROSCOPIC
Bilirubin Urine: NEGATIVE
Glucose, UA: >1000 mg/dL — AB
KETONES UR: NEGATIVE mg/dL
Nitrite: NEGATIVE
PH: 5 (ref 5.0–8.0)
PROTEIN: 30 mg/dL — AB
Specific Gravity, Urine: 1.034 — ABNORMAL HIGH (ref 1.005–1.030)
Urobilinogen, UA: 0.2 mg/dL (ref 0.0–1.0)

## 2014-03-08 LAB — GLUCOSE, CAPILLARY
GLUCOSE-CAPILLARY: 120 mg/dL — AB (ref 70–99)
GLUCOSE-CAPILLARY: 129 mg/dL — AB (ref 70–99)
GLUCOSE-CAPILLARY: 133 mg/dL — AB (ref 70–99)
GLUCOSE-CAPILLARY: 175 mg/dL — AB (ref 70–99)
GLUCOSE-CAPILLARY: 223 mg/dL — AB (ref 70–99)
GLUCOSE-CAPILLARY: 245 mg/dL — AB (ref 70–99)
GLUCOSE-CAPILLARY: 246 mg/dL — AB (ref 70–99)
GLUCOSE-CAPILLARY: 576 mg/dL — AB (ref 70–99)
Glucose-Capillary: 429 mg/dL — ABNORMAL HIGH (ref 70–99)
Glucose-Capillary: 393 mg/dL — ABNORMAL HIGH (ref 70–99)
Glucose-Capillary: 332 mg/dL — ABNORMAL HIGH (ref 70–99)
Glucose-Capillary: 434 mg/dL — ABNORMAL HIGH (ref 70–99)
Glucose-Capillary: 414 mg/dL — ABNORMAL HIGH (ref 70–99)
Glucose-Capillary: 312 mg/dL — ABNORMAL HIGH (ref 70–99)
Glucose-Capillary: 172 mg/dL — ABNORMAL HIGH (ref 70–99)
Glucose-Capillary: 103 mg/dL — ABNORMAL HIGH (ref 70–99)
Glucose-Capillary: 135 mg/dL — ABNORMAL HIGH (ref 70–99)
Glucose-Capillary: 278 mg/dL — ABNORMAL HIGH (ref 70–99)

## 2014-03-08 LAB — CBC
HEMATOCRIT: 38.9 % (ref 36.0–46.0)
Hemoglobin: 12.9 g/dL (ref 12.0–15.0)
MCH: 30.5 pg (ref 26.0–34.0)
MCHC: 33.2 g/dL (ref 30.0–36.0)
MCV: 92 fL (ref 78.0–100.0)
PLATELETS: 147 10*3/uL — AB (ref 150–400)
RBC: 4.23 MIL/uL (ref 3.87–5.11)
RDW: 13.9 % (ref 11.5–15.5)
WBC: 7.1 10*3/uL (ref 4.0–10.5)

## 2014-03-08 LAB — BASIC METABOLIC PANEL
ANION GAP: 15 (ref 5–15)
Anion gap: 15 (ref 5–15)
Anion gap: 7 (ref 5–15)
Anion gap: 6 (ref 5–15)
BUN: 31 mg/dL — AB (ref 6–23)
BUN: 32 mg/dL — ABNORMAL HIGH (ref 6–23)
BUN: 32 mg/dL — ABNORMAL HIGH (ref 6–23)
BUN: 29 mg/dL — AB (ref 6–23)
CALCIUM: 8.7 mg/dL (ref 8.4–10.5)
CHLORIDE: 108 mmol/L (ref 96–112)
CO2: 17 mmol/L — AB (ref 19–32)
CO2: 15 mmol/L — ABNORMAL LOW (ref 19–32)
CO2: 21 mmol/L (ref 19–32)
CO2: 22 mmol/L (ref 19–32)
Calcium: 8.7 mg/dL (ref 8.4–10.5)
Calcium: 8.4 mg/dL (ref 8.4–10.5)
Calcium: 8.1 mg/dL — ABNORMAL LOW (ref 8.4–10.5)
Chloride: 100 mmol/L (ref 96–112)
Chloride: 104 mmol/L (ref 96–112)
Chloride: 107 mmol/L (ref 96–112)
Creatinine, Ser: 1.4 mg/dL — ABNORMAL HIGH (ref 0.50–1.10)
Creatinine, Ser: 1.35 mg/dL — ABNORMAL HIGH (ref 0.50–1.10)
Creatinine, Ser: 1.45 mg/dL — ABNORMAL HIGH (ref 0.50–1.10)
Creatinine, Ser: 1.13 mg/dL — ABNORMAL HIGH (ref 0.50–1.10)
GFR calc Af Amer: 43 mL/min — ABNORMAL LOW (ref >90)
GFR calc Af Amer: 56 mL/min — ABNORMAL LOW (ref >90)
GFR calc non Af Amer: 37 mL/min — ABNORMAL LOW (ref >90)
GFR calc non Af Amer: 39 mL/min — ABNORMAL LOW (ref >90)
GFR calc non Af Amer: 36 mL/min — ABNORMAL LOW (ref >90)
GFR, EST AFRICAN AMERICAN: 45 mL/min — AB (ref >90)
GFR, EST AFRICAN AMERICAN: 42 mL/min — AB (ref >90)
GFR, EST NON AFRICAN AMERICAN: 48 mL/min — AB (ref >90)
Glucose, Bld: 512 mg/dL — ABNORMAL HIGH (ref 70–99)
Glucose, Bld: 476 mg/dL — ABNORMAL HIGH (ref 70–99)
Glucose, Bld: 329 mg/dL — ABNORMAL HIGH (ref 70–99)
Glucose, Bld: 123 mg/dL — ABNORMAL HIGH (ref 70–99)
POTASSIUM: 5.8 mmol/L — AB (ref 3.5–5.1)
POTASSIUM: 4.5 mmol/L (ref 3.5–5.1)
Potassium: 6.5 mmol/L (ref 3.5–5.1)
Potassium: 3.9 mmol/L (ref 3.5–5.1)
SODIUM: 132 mmol/L — AB (ref 135–145)
Sodium: 134 mmol/L — ABNORMAL LOW (ref 135–145)
Sodium: 136 mmol/L (ref 135–145)
Sodium: 135 mmol/L (ref 135–145)

## 2014-03-08 LAB — URINE MICROSCOPIC-ADD ON

## 2014-03-08 LAB — MAGNESIUM: Magnesium: 2.1 mg/dL (ref 1.5–2.5)

## 2014-03-08 LAB — HEPARIN LEVEL (UNFRACTIONATED)
HEPARIN UNFRACTIONATED: 0.41 [IU]/mL (ref 0.30–0.70)
Heparin Unfractionated: 0.41 IU/mL (ref 0.30–0.70)

## 2014-03-08 LAB — MRSA PCR SCREENING: MRSA by PCR: NEGATIVE

## 2014-03-08 LAB — BRAIN NATRIURETIC PEPTIDE: B NATRIURETIC PEPTIDE 5: 848.9 pg/mL — AB (ref 0.0–100.0)

## 2014-03-08 MED ORDER — INSULIN ASPART 100 UNIT/ML ~~LOC~~ SOLN
0.0000 [IU] | Freq: Three times a day (TID) | SUBCUTANEOUS | Status: DC
  Administered 2014-03-08: 2 [IU] via SUBCUTANEOUS
  Administered 2014-03-09: 7 [IU] via SUBCUTANEOUS

## 2014-03-08 MED ORDER — SODIUM CHLORIDE 0.9 % IV SOLN
1.0000 g | Freq: Once | INTRAVENOUS | Status: AC
  Administered 2014-03-08: 1 g via INTRAVENOUS
  Filled 2014-03-08: qty 10

## 2014-03-08 MED ORDER — DEXTROSE 50 % IV SOLN
1.0000 | Freq: Once | INTRAVENOUS | Status: AC
  Administered 2014-03-08: 50 mL via INTRAVENOUS
  Filled 2014-03-08: qty 50

## 2014-03-08 MED ORDER — SODIUM CHLORIDE 0.9 % IJ SOLN
10.0000 mL | INTRAMUSCULAR | Status: DC | PRN
  Administered 2014-03-11 – 2014-03-15 (×4): 10 mL
  Filled 2014-03-08 (×4): qty 40

## 2014-03-08 MED ORDER — INSULIN GLARGINE 100 UNIT/ML ~~LOC~~ SOLN
10.0000 [IU] | Freq: Every day | SUBCUTANEOUS | Status: DC
  Administered 2014-03-08 – 2014-03-09 (×2): 10 [IU] via SUBCUTANEOUS
  Filled 2014-03-08 (×2): qty 0.1

## 2014-03-08 MED ORDER — SODIUM CHLORIDE 0.9 % IJ SOLN
10.0000 mL | Freq: Two times a day (BID) | INTRAMUSCULAR | Status: DC
  Administered 2014-03-08 – 2014-03-14 (×3): 10 mL

## 2014-03-08 MED ORDER — SODIUM CHLORIDE 0.9 % IV SOLN: INTRAVENOUS | Status: DC | PRN

## 2014-03-08 MED ORDER — INSULIN ASPART 100 UNIT/ML ~~LOC~~ SOLN
10.0000 [IU] | Freq: Once | SUBCUTANEOUS | Status: AC
  Administered 2014-03-08: 10 [IU] via SUBCUTANEOUS

## 2014-03-08 MED ORDER — DEXTROSE 5 % IV SOLN
1.0000 g | INTRAVENOUS | Status: AC
  Administered 2014-03-08 – 2014-03-14 (×7): 1 g via INTRAVENOUS
  Filled 2014-03-08 (×7): qty 10

## 2014-03-08 MED ORDER — ONDANSETRON HCL 4 MG/2ML IJ SOLN
4.0000 mg | Freq: Four times a day (QID) | INTRAMUSCULAR | Status: DC | PRN
  Administered 2014-03-08: 4 mg via INTRAVENOUS
  Filled 2014-03-08: qty 2

## 2014-03-08 NOTE — Progress Notes (Signed)
Contacted by Santiago GladAllison Koontz, RN that Dr. Elvera LennoxGherghe wants PICC line placed at this time. Will continue to monitor.

## 2014-03-08 NOTE — Progress Notes (Signed)
Echocardiogram 2D Echocardiogram has been performed.  [name] 03/08/2014, 1:53 PM

## 2014-03-08 NOTE — Progress Notes (Signed)
Vascular lab tech notified RN of positive DVTs in both lower extremities. 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 Dr. Elvera LennoxGherghe notifed of limited options for continued blood pressure readings. IV therapy in to see patient and recommend subclavian line placement by IR. Per Dr. Elvera LennoxGherghe RN is allowed to obtain BP readings on right forearm and move forward with PICC placement today.

## 2014-03-08 NOTE — Care Management Note (Signed)
CARE MANAGEMENT NOTE 03/08/2014  Patient:  [name],[name]   Account Number:  [id number]  Date Initiated:  03/08/2014  Documentation initiated by:  DAVIS,RHONDA  Subjective/Objective Assessment:   pe and dvt confirmed     Action/Plan:   home when stable hx of same in 2010   Anticipated DC Date:  03/11/2014   Anticipated DC Plan:  HOME/SELF CARE         Choice offered to / List presented to:             Status of service:  In process, will continue to follow Medicare Important Message given?   (If response is "NO", the following Medicare IM given date fields will be blank) Date Medicare IM given:   Medicare IM given by:   Date Additional Medicare IM given:   Additional Medicare IM given by:    Discharge Disposition:    Per UR Regulation:  Reviewed for med. necessity/level of care/duration of stay  If discussed at Long Length of Stay Meetings, dates discussed:    Comments:  Anola GurneyRhonda Davis,RN,BSn,CCM

## 2014-03-08 NOTE — Progress Notes (Signed)
ANTICOAGULATION CONSULT NOTE - F/u Consult  Pharmacy Consult for Heparin Indication: Rule out PE  No Known Allergies  Patient Measurements: Height: 5\' 10"  (177.8 cm) Weight: 256 lb 9.9 oz (116.4 kg) IBW/kg (Calculated) : 68.5 Heparin Dosing Weight: 93.8 kg  Vital Signs: Temp: 97.3 F (36.3 C) (03/01 0400) Temp Source: Axillary (03/01 0400) BP: 100/35 mmHg (03/01 0600) Pulse Rate: 90 (03/01 0600)  Labs:  Recent Labs  03/07/14 1829 03/07/14 1830 03/07/14 2242 03/08/14 0242 03/08/14 0630  HGB 13.7  --   --   --  12.9  HCT 42.4  --   --   --  38.9  PLT 184  --   --   --  147*  APTT  --  27  --   --   --   LABPROT  --  16.3*  --   --   --   INR  --  1.30  --   --   --   HEPARINUNFRC  --   --   --   --  0.41  CREATININE 1.56*  --  1.40* 1.35*  --     Estimated Creatinine Clearance: 54.5 mL/min (by C-G formula based on Cr of 1.35).   Medical History: Past Medical History  Diagnosis Date  . Pregnancy   . PE (pulmonary embolism) 8 2010  . Diabetes   . Hypothyroid   . HLD (hyperlipidemia)   . Hypertension     Medications:  Prescriptions prior to admission  Medication Sig Dispense Refill Last Dose  . atorvastatin (LIPITOR) 20 MG tablet Take 20 mg by mouth daily.   03/06/2014 at Unknown time  . diltiazem (CARDIZEM CD) 180 MG 24 hr capsule Take 1 capsule by mouth daily.   03/07/2014 at Unknown time  . glimepiride (AMARYL) 4 MG tablet Take 4 mg by mouth 2 (two) times daily.   03/07/2014 at Unknown time  . levothyroxine (SYNTHROID, LEVOTHROID) 112 MCG tablet Take 112 mcg by mouth daily before breakfast.   03/07/2014 at Unknown time  . lisinopril (PRINIVIL,ZESTRIL) 20 MG tablet Take 20 mg by mouth daily.   03/07/2014 at Unknown time  . metFORMIN (GLUCOPHAGE) 1000 MG tablet Take 1,000 mg by mouth 2 (two) times daily with a meal.   03/07/2014 at Unknown time  . Multiple Vitamins-Minerals (MULTIVITAMIN WITH MINERALS) tablet Take 1 tablet by mouth daily.   03/06/2014 at Unknown  time    Assessment: 69 yo female who presents with increasing shortness of breath and RLE swelling over the past 7 days. She has history of PE several years ago for which she was previously on coumadin for 6 months. D-dimer >20, currently unable to receive IV contrast due to renal function. Pharmacy is consulted to dose IV heparin for presumed PE. Today, 3/1/216  0600 HL=0.41, no problems reported  Goal of Therapy:  Heparin level 0.3-0.7 units/ml Monitor platelets by anticoagulation protocol: Yes   Plan:   Continue heparin  infusion at 1500 units/hr  Recheck HL with next BMET (~1030)  Daily heparin level and CBC   Susanne GreenhouseGreen, Ammara Raj R 03/08/2014,6:59 AM

## 2014-03-08 NOTE — Progress Notes (Signed)
Pharmacy - Heparin confirmatory level  Assessment: 69 yo female who presents with increasing shortness of breath and RLE swelling over the past 7 days. She has history of PE several years ago for which she was previously on coumadin for 6 months. D-dimer >20, currently unable to receive IV contrast due to renal function. Pharmacy is consulted to dose IV heparin for presumed PE.  Today, 3/1/216  0600 HL=0.41, no problems reported  1300 confirmatory HL 0.41  Goal of Therapy:  Heparin level 0.3-0.7 units/ml Monitor platelets by anticoagulation protocol: Yes  Plan:   Continue heparin infusion at 1500 units/hr  Daily heparin level and CBC  Bernadene Personrew Latangela Mccomas, PharmD Pager: [phone] 03/08/2014, 3:31 PM

## 2014-03-08 NOTE — Progress Notes (Signed)
VASCULAR LAB PRELIMINARY  PRELIMINARY  PRELIMINARY  PRELIMINARY  Bilateral lower extremity venous duplex completed.    Preliminary report:  Right - Positive for an acute deep vein thrombosis coursing through the posterior tibial veins into the popliteal, femoral, to the mid common femoral vein just distal to the saphenofemoral junction. There is no evidence of a superficial thrombosis involving the right. 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 The vein is nearly compressible, not dilated, and the clot is located along the wall of the vein all consistent with a chronic DVT. Patient states that she had a clot in 2010 however a venous duplex was not completed [facility]. If one was completed at another facility we have no results to compare. The chest angio was positive for a pulmonary embolus at that time  Payam Gribble, RVS 03/08/2014, 11:16 AM

## 2014-03-08 NOTE — Progress Notes (Addendum)
Pt admitted by [facility] a few hours ago, found to have DKA and DDimer over 20-indicating probable PE. CTA chest ordered for am. Dopplers in am. Already on Heparin drip.  After coming to the SDU, RN paged secondary to pt having poor venous access with a BP ? in the 60s. IV team trying to get 2nd IV (due to non compatible meds) and lab attempting to draw BMP. Per RN, pt feels like she needs to void and can't.  NP to bedside.  S: Pt says she doesn't feel terrible but not great either. She presently denies any chest pain. Mildly SOB which is improved.   O: Obese WF in NAD. Alert, oriented and appropriate in thought and action. VS reviewed. BP with cuff on leg is 120s/70s (this is more in line with her mental status). No respiratory distress on exam. No increased WOB.  A/P: 1. Poor venous access-IV team able to get 2nd IV. Lab was able to draw labs as well.          2. Hypotension-initial BPs do not match physical exam. More inclined to agree with cuff on leg readings.                            Considered A line for accurate BPs and labs, but given lab was able to draw and she is alert, will continue to                    monitor BPs on the leg for now.          3. PE-continue IV Heparin. CTA chest in am. Venous dopplers BLE in am.          4. DKA-in absence of IV and inability to start insulin drip, gave a one time dose of Novolog 15U subq. Per BMP,               sugar coming down some, and CO2 up to 17. Gap remains 15. Bolus in progress. IVF as well. Continue BMP q4.          K came back slightly high, but insulin drip just starting which should decrease the K. Will follow next BMP. No                 changes on tele.          5. I&O-Foley for accuracy. Can be removed when DKA resolved.  Jimmye NormanKaren Kirby-Graham, NP [facility] Update: Pt's BP is stable. Lab still having difficulty drawing labs. Left message for oncoming doc that pt may need some sort of line. K was higher earlier and Ca Gluc, Novolog, and  D50 was given. Gap and CO2 were 15 last check. Next BMP and am labs are being attempted now (0630).  KJKG, NP

## 2014-03-08 NOTE — Progress Notes (Signed)
Per Clydie BraunKaren, NP hold Aline for now.  RT to monitor and assess as needed.

## 2014-03-08 NOTE — Progress Notes (Signed)
PROGRESS NOTE  [name] DOB: 1945-05-19 DOA: 03/07/2014 PCP: Gweneth DimitriMCNEILL,WENDY, MD  HPI: RLE swelling. sarted 10 days ago. Placed compression stockings w/ improvement. 6 days ago developed acute SOB. Constant. Getting worse. Rest w/o improvement. Associated with dizziness and weakness. Denies cough, CP, palpitations, fevers unexplained wt loss, night sweats.  Patient admitted on 2/29 with presumed PE, started on heparin drip   Subjective / 24 H Interval events - She is feeling slightly better this morning, still complains of dyspnea, denies any chest pain, denies any abdominal pain nausea or vomiting - Had been having difficulties overnight with poor access  Assessment/Plan: Principal Problem:   Acute pulmonary embolism Active Problems:   Dyspnea   SOB (shortness of breath)   Metabolic acidosis   Hyperglycemia   Diabetes mellitus out of control   AKI (acute kidney injury)   Hyperkalemia   Hyponatremia   HLD (hyperlipidemia)   Essential hypertension   Hypothyroidism   Dehydration  Acute hypoxic respiratory failure - likely due to PE, she has mild renal dysfunction and cannot obtain a CT PA, however lower extremity Dopplers are positive for DVT - Assess with a 2-D echo to evaluate right heart - She is stable on nasal cannula - Continue heparin drip - Continue monitoring in stepdown  Acute DVT - Continue heparin drip, once better assess heart function and consider transitioning to oral agents - DVT US Right leg - Positive for an acute deep vein thrombosis coursing through the posterior tibial veins into the popliteal, femoral, to the mid common femoral vein just distal to the saphenofemoral junction.  Hyperglycemic state / DM 2- started on glucostabilizer, patient not on insulin at home with only on metformin and glimepiride - start Lantus 10 U once can come off of the drip - check A1C  Acute kidney injury: creatinine1.56 baseline1.0, likely in the setting  of dehydration due to hyperglycemic state - Improving with IV fluids  Hyperkalemia: - IV hydration - Insulin and glucose as above - monitor closely, improving  HLD: -continue lipitor  Hypothyroid:  - continue syntrhoid  HTN: nml to elevated on admission - continue home Cardizem - hold lisinopril due to AKI - Hydralazine PRN SBP >180  Possible UTI  - cultures, Ceftriaxone - +symptoms  Diet: Diet NPO time specified Fluids: NS DVT Prophylaxis: heparin gtt  Code Status: Full Code Family Communication: none bedside  Disposition Plan: remain in SDU  Consultants:  None   Procedures:  None    Antibiotics  Anti-infectives    Start     Dose/Rate Route Frequency Ordered Stop   03/08/14 0800  cefTRIAXone (ROCEPHIN) 1 g in dextrose 5 % 50 mL IVPB     1 g 100 mL/hr over 30 Minutes Intravenous Every 24 hours 03/08/14 0641         Studies  Dg Chest Port 1 View  03/07/2014   CLINICAL DATA:  Acute onset of cough and chills. Shortness of breath. Initial encounter.  EXAM: PORTABLE CHEST - 1 VIEW  COMPARISON:  Chest radiograph performed 08/09/2008, and CTA of the chest from 08/10/2008  FINDINGS: The lungs are well-aerated and clear. There is no evidence of focal opacification, pleural effusion or pneumothorax.  The cardiomediastinal silhouette is borderline normal in size. No acute osseous abnormalities are seen. Scattered clips are seen overlying the right axilla.  IMPRESSION: No acute cardiopulmonary process seen.   Electronically Signed   By: Roanna RaiderJeffery  Chang M.D.   On: 03/07/2014 19:00    Objective  Filed Vitals:   03/08/14 0300 03/08/14 0400 03/08/14 0500 03/08/14 0600  BP: 140/54 111/62 103/74 100/35  Pulse: 88 88 89 90  Temp:  97.3 F (36.3 C)    TempSrc:  Axillary    Resp: Height:      Weight:  116.4 kg (256 lb 9.9 oz)    SpO2: 99% 98% 99% 98%    Intake/Output Summary (Last 24 hours) at 03/08/14 0637 Last data filed at 03/08/14 0600  Gross per  24 hour  Intake 3100.77 ml  Output    325 ml  Net 2775.77 ml   Filed Weights   03/07/14 2058 03/08/14 0400  Weight: 113.399 kg (250 lb) 116.4 kg (256 lb 9.9 oz)    Exam:  General:  NAD  HEENT: no scleral icterus  Cardiovascular: RRR without MRG  Respiratory: CTA biL  Abdomen: soft, non tender  MSK/Extremities: no clubbing/cyanosis, LLE > RLE  Skin: no rashes  Neuro:  Non focal  Data Reviewed: Basic Metabolic Panel:  Recent Labs Lab 03/07/14 1829 03/07/14 2242 03/08/14 0242 03/08/14 0630  NA 129* 132* 134* 136  K 5.5* 5.8* 6.5* 4.5  CL 98 100 104 108  CO2 16* 17* 15* 21  GLUCOSE 555* 512* 476* 329*  BUN 28* 31* 32* 32*  CREATININE 1.56* 1.40* 1.35* 1.45*  CALCIUM 8.8 8.7 8.7 8.4  MG  --  2.1  --   --    CBC:  Recent Labs Lab 03/07/14 1829  WBC 8.7  HGB 13.7  HCT 42.4  MCV 94.4  PLT 184   BNP (last 3 results)  Recent Labs  03/07/14 2241  BNP 848.9*   CBG:  Recent Labs Lab 03/08/14 0828 03/08/14 0937 03/08/14 1040 03/08/14 1130 03/08/14 1242  GLUCAP 246* 223* 172* 129* 103*    Recent Results (from the past 240 hour(s))  MRSA PCR Screening     Status: None   Collection Time: 03/07/14 10:50 PM  Result Value Ref Range Status   MRSA by PCR NEGATIVE NEGATIVE Final    Comment:        The GeneXpert MRSA Assay (FDA approved for NASAL specimens only), is one component of a comprehensive MRSA colonization surveillance program. It is not intended to diagnose MRSA infection nor to guide or monitor treatment for MRSA infections.      Scheduled Meds: . atorvastatin  20 mg Oral Daily  . diltiazem  180 mg Oral Daily  . levothyroxine  112 mcg Oral QAC breakfast  . sodium chloride  3 mL Intravenous Q12H   Continuous Infusions: . sodium chloride 125 mL/hr at 03/07/14 2316  . dextrose 5 % and 0.45% NaCl    . heparin 1,500 Units/hr (03/07/14 2141)  . insulin (NOVOLIN-R) infusion 12.6 Units/hr (03/08/14 4782)    Pamella Pert,  MD [facility] Pager [phone]. If 7 PM - 7 AM, please contact night-coverage at [email or web address], password [facility] 03/08/2014, 6:37 AM  LOS: 1 day

## 2014-03-08 NOTE — Progress Notes (Signed)
1200Peripherally Inserted Central Catheter/Midline Placement  The IV Nurse has discussed with the patient and/or persons authorized to consent for the patient, the purpose of this procedure and the potential benefits and risks involved with this procedure.  The benefits include less needle sticks, lab draws from the catheter and patient may be discharged home with the catheter.  Risks include, but not limited to, infection, bleeding, blood clot (thrombus formation), and puncture of an artery; nerve damage and irregular heat beat.  Alternatives to this procedure were also discussed.  PICC/Midline Placement Documentation  PICC / Midline Double Lumen 03/08/14 PICC Left Basilic 48 cm 0 cm (Active)  Indication for Insertion or Continuance of Line Poor Vasculature-patient has had multiple peripheral attempts or PIVs lasting less than 24 hours 03/08/2014 12:00 PM  Exposed Catheter (cm) 0 cm 03/08/2014 12:00 PM  Dressing Change Due 03/15/14 03/08/2014 12:00 PM       [name], [name] 03/08/2014, 12:28 PM

## 2014-03-08 NOTE — Progress Notes (Signed)
CRITICAL VALUE ALERT  Critical value received: Potassium 6.5  Date of notification:  03/08/2014  Time of notification:  0358  Critical value read back:Yes.    Nurse who received alert:  Lyman Bishoparoline Creecj  MD notified (1st page):  Craige CottaKirby  Time of first page:  0400  MD notified (2nd page):  Time of second page:  Responding MD:  Craige CottaKirby  Time MD responded:  [phone]

## 2014-03-08 NOTE — Progress Notes (Signed)
Evaluated for left upper arm PICC, pt with right arm restricted and bilateral LE DVT. Recommended Power line be placed by IR. RN aware. Will continue to follow.

## 2014-03-09 LAB — CBC
HCT: 37.7 % (ref 36.0–46.0)
HEMOGLOBIN: 12.2 g/dL (ref 12.0–15.0)
MCH: 30.1 pg (ref 26.0–34.0)
MCHC: 32.4 g/dL (ref 30.0–36.0)
MCV: 93.1 fL (ref 78.0–100.0)
Platelets: 158 10*3/uL (ref 150–400)
RBC: 4.05 MIL/uL (ref 3.87–5.11)
RDW: 14.2 % (ref 11.5–15.5)
WBC: 11.8 10*3/uL — ABNORMAL HIGH (ref 4.0–10.5)

## 2014-03-09 LAB — BASIC METABOLIC PANEL
Anion gap: 7 (ref 5–15)
BUN: 28 mg/dL — AB (ref 6–23)
CHLORIDE: 105 mmol/L (ref 96–112)
CO2: 21 mmol/L (ref 19–32)
CREATININE: 1.1 mg/dL (ref 0.50–1.10)
Calcium: 8.2 mg/dL — ABNORMAL LOW (ref 8.4–10.5)
GFR calc Af Amer: 58 mL/min — ABNORMAL LOW (ref >90)
GFR calc non Af Amer: 50 mL/min — ABNORMAL LOW (ref >90)
GLUCOSE: 286 mg/dL — AB (ref 70–99)
Potassium: 4.7 mmol/L (ref 3.5–5.1)
Sodium: 133 mmol/L — ABNORMAL LOW (ref 135–145)

## 2014-03-09 LAB — URINE CULTURE
COLONY COUNT: NO GROWTH
Culture: NO GROWTH

## 2014-03-09 LAB — GLUCOSE, CAPILLARY
GLUCOSE-CAPILLARY: 303 mg/dL — AB (ref 70–99)
Glucose-Capillary: 404 mg/dL — ABNORMAL HIGH (ref 70–99)
Glucose-Capillary: 296 mg/dL — ABNORMAL HIGH (ref 70–99)
Glucose-Capillary: 205 mg/dL — ABNORMAL HIGH (ref 70–99)

## 2014-03-09 LAB — GLUCOSE, RANDOM: Glucose, Bld: 385 mg/dL — ABNORMAL HIGH (ref 70–99)

## 2014-03-09 LAB — HEMOGLOBIN A1C
Hgb A1c MFr Bld: 9.2 % — ABNORMAL HIGH (ref 4.8–5.6)
Mean Plasma Glucose: 217 mg/dL

## 2014-03-09 LAB — HEPARIN LEVEL (UNFRACTIONATED): Heparin Unfractionated: 0.6 IU/mL (ref 0.30–0.70)

## 2014-03-09 MED ORDER — INSULIN STARTER KIT- PEN NEEDLES (ENGLISH)
1.0000 | Freq: Once | Status: AC
  Administered 2014-03-09: 1
  Filled 2014-03-09: qty 1

## 2014-03-09 MED ORDER — INSULIN ASPART 100 UNIT/ML ~~LOC~~ SOLN
3.0000 [IU] | Freq: Three times a day (TID) | SUBCUTANEOUS | Status: DC
  Administered 2014-03-09 – 2014-03-11 (×6): 3 [IU] via SUBCUTANEOUS

## 2014-03-09 MED ORDER — INSULIN ASPART 100 UNIT/ML ~~LOC~~ SOLN
20.0000 [IU] | Freq: Once | SUBCUTANEOUS | Status: AC
  Administered 2014-03-09: 20 [IU] via SUBCUTANEOUS

## 2014-03-09 MED ORDER — INSULIN ASPART 100 UNIT/ML ~~LOC~~ SOLN
0.0000 [IU] | Freq: Three times a day (TID) | SUBCUTANEOUS | Status: DC
  Administered 2014-03-09 – 2014-03-10 (×2): 11 [IU] via SUBCUTANEOUS
  Administered 2014-03-10: 7 [IU] via SUBCUTANEOUS
  Administered 2014-03-10 – 2014-03-11 (×2): 11 [IU] via SUBCUTANEOUS
  Administered 2014-03-11 – 2014-03-12 (×5): 4 [IU] via SUBCUTANEOUS
  Administered 2014-03-13: 7 [IU] via SUBCUTANEOUS
  Administered 2014-03-13 – 2014-03-14 (×2): 3 [IU] via SUBCUTANEOUS
  Administered 2014-03-14 – 2014-03-15 (×2): 4 [IU] via SUBCUTANEOUS

## 2014-03-09 MED ORDER — INSULIN GLARGINE 100 UNIT/ML ~~LOC~~ SOLN
15.0000 [IU] | Freq: Every day | SUBCUTANEOUS | Status: DC
  Administered 2014-03-10 – 2014-03-11 (×2): 15 [IU] via SUBCUTANEOUS
  Filled 2014-03-09 (×2): qty 0.15

## 2014-03-09 NOTE — Progress Notes (Signed)
Patient ID: [name], female   DOB: [birth year], 69 y.o.   MRN: [id number]  [facility] PROGRESS NOTE  [name] OMV:[id number] DOB: 03/25/1945 DOA: 03/07/2014 PCP: Cari Caraway, MD  Brief narrative:    Pt is 69 yo female who presented to [facility] with main concern of right lower extremity swelling that started 10 days prior to admission. Patient has tried compression stockings for several days with minimum improvement in swelling. She has developed progressive dyspnea with exertion and at rest. She was admitted for further evaluation, noted to have pulmonary embolus and was started on heparin drip.  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 - A1c 9.1 - Patient on metformin and glimepiride at home - Currently on Lantus 10 units with sliding scale insulin, will increase Lantus to 15 U for better CBG control  - Would avoid metformin for an now until renal function stabilizes for at least 72 hours   Acute on chronic kidney disease, 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 - Review of records indicate that in 2010 patient had GFR in 50's, her creatinine has however remained stable and within normal limits - Acute elevation in creatinine this admission likely secondary to dehydration, prerenal etiology - IV fluids have been provided and creatinine is now within normal limits   Hyponatremia - pre renal in etiology - encouraged PO intake and will repeat BMP in AM   HLD (hyperlipidemia) - Continue statin    Essential hypertension - Reasonable inpatient control - continue Cardizem - lisinopril on hold until renal function stabilized at least for 72 hours    Hypothyroidism - Continue Synthroid   Morbid obesity - Patient meets criteria for morbid obesity given comorbid conditions including hypertension, hyperlipidemia, diabetes and BMI greater than 36 -  Body mass index is 38.28 kg/(m^2)   SIRS secondary to UTI - criteria met with T 101.1 F, RR 23, WBC 11 K - started on rocephin 3/1 - continue same ABX and follow up on urine culture    DVT prophylaxis - already on Heparin drip for DVT and PE   Code Status: Full.  Family Communication:  plan of care discussed with the patient Disposition Plan: Transfer to telemetry bed   IV access:  Peripheral IV  Procedures and diagnostic studies:    Dg Chest Port 1 View  03/07/2014   No acute cardiopulmonary process seen.     Medical Consultants:  None  Other Consultants:  Pharmacy for Heparin dosing   IAnti-Infectives:   Rocephin 3/1 -->   Faye Ramsay, MD  [facility] Pager [phone]  If 7PM-7AM, please contact night-coverage [email or web address] Password [email or web address] 03/09/2014, 11:07 AM   LOS: 2 days   HPI/Subjective: No events overnight.   Objective: Filed Vitals:   03/09/14 0500 03/09/14 0600 03/09/14 0700 03/09/14 0800  BP:  124/57  114/73  Pulse: 81 81 80 88  Temp: 100.2 F (37.9 C) 99.7 F (37.6 C) 99.7 F (37.6 C) 97.7 F (36.5 C)  TempSrc:   Core (Comment) Core (Comment)  Resp: _0 Height:  Weight:      SpO2: 97% 96% 96% 96%    Intake/Output Summary (Last 24 hours) at 03/09/14 1107 Last data filed at 03/09/14 0900  Gross per 24 hour  Intake 1682.46 ml  Output    760 ml  Net 922.46 ml    Exam:   General:  Pt is alert, follows commands appropriately, not in acute distress  Cardiovascular: Regular rate and rhythm, S1/S2, no murmurs, no rubs, no gallops  Respiratory: Clear to auscultation bilaterally, no  wheezing, diminished breath sounds at bases   Abdomen: Soft, non tender, non distended, bowel sounds present, no guarding  Extremities: RLE swelling and TTP, no edema in LLE, pulses DP and PT palpable bilaterally  Neuro: Grossly nonfocal  Data Reviewed: Basic Metabolic Panel:  Recent Labs Lab 03/07/14 2242 03/08/14 0242 03/08/14 0630 03/08/14 1300 03/09/14 0441  NA 132* 134* 136 135 133*  K 5.8* 6.5* 4.5 3.9 4.7  CL 100 104 108 107 105  CO2 17* 15* _0 GLUCOSE 512* 476* 329* 123* 286*  BUN 31* 32* 32* 29* 28*  CREATININE 1.40* 1.35* 1.45* 1.13* 1.10  CALCIUM 8.7 8.7 8.4 8.1* 8.2*  MG 2.1  --   --   --   --    CBC:  Recent Labs Lab 03/07/14 1829 03/08/14 0630 03/09/14 0441  WBC 8.7 7.1 11.8*  HGB 13.7 12.9 12.2  HCT 42.4 38.9 37.7  MCV 94.4 92.0 93.1  PLT 184 147* 158   CBG:  Recent Labs Lab 03/08/14 1453 03/08/14 1556 03/08/14 1707 03/08/14 2144 03/09/14 0803  GLUCAP 133* 135* 175* 278* 303*    Recent Results (from the past 240 hour(s))  MRSA PCR Screening     Status: None   Collection Time: 03/07/14 10:50 PM  Result Value Ref Range Status   MRSA by PCR NEGATIVE NEGATIVE Final    Comment:        The GeneXpert MRSA Assay (FDA approved for NASAL specimens only), is one component of a comprehensive MRSA colonization surveillance program. It is not intended to diagnose MRSA infection nor to guide or monitor treatment for MRSA infections.      Scheduled Meds: . atorvastatin  20 mg Oral Daily  . cefTRIAXone (ROCEPHIN)  IV  1 g Intravenous Q24H  . diltiazem  180 mg Oral Daily  . insulin aspart  0-9 Units Subcutaneous TID WC  . insulin glargine  10 Units Subcutaneous Daily  . levothyroxine  112 mcg Oral QAC breakfast  . sodium chloride  10-40 mL Intracatheter Q12H  . sodium chloride  3 mL Intravenous Q12H   Continuous Infusions: . heparin 1,500 Units/hr (03/09/14 0852)

## 2014-03-09 NOTE — Progress Notes (Signed)
Inpatient Diabetes Program Recommendations  AACE/ADA: New Consensus Statement on Inpatient Glycemic Control (2013)  Target Ranges:  Prepandial:   less than 140 mg/dL      Peak postprandial:   less than 180 mg/dL (1-2 hours)      Critically ill patients:  140 - 180 mg/dL     **Spoke to patient about her current A1c of 9.2%.  Explained what an A1c is and what it measures.  Reminded patient that her goal A1c is 7% or less per ADA standards to prevent both acute and long-term complications.  Explained to patient the extreme importance of good glucose control at home.    **Patient stated she has been on the same doses of her Amaryl and Metformin for a long time now.  Had a discussion with her PCP at her last visit about improving glucose control before adding insulin to home regimen.  Patient stated she visited with a Certified Diabetes Educator and tried to make changes at home but has been unsuccessful bringing her A1c down to 7%.  Patient stated she is open to using insulin at home and would prefer insulin pens if the physician decides to send her home on insulin.  **Educated patient on insulin pen use at home.  Reviewed all steps if insulin pen including attachment of needle, 2-unit air shot, dialing up dose, giving injection, removing needle, disposal of sharps, storage of unused insulin, disposal of insulin etc.  Patient able to provide successful return demonstration.  Also reviewed troubleshooting with insulin pen.    **Discussed with patient what Lantus insulin is and how it works.  Reminded patient that if she is discharged home on Lantus that she needs to take it once every 24 hours and needs to be consistent in the timing of the insulin.  Also reminded patient to take Lantus even when she is not eating well as this insulin is to cover her basal, non-eating insulin needs.    MD, if you decide to send patient home on insulin, please give patient Rxs for for insulin pens and insulin pen  needles.    Can use the following order numbers when discharging patient if helpful:  Lantus Solostar insulin pen [Order # [id number]]  Insulin pen needles- 31 gauge x 5mm [Order # [id number]]  Novolog Flexpen (if you decide to send home on Novolog as well) [Order # [id number]]    Will follow Sarah FinlandJeannine Esparza Sarah Carrier RN, MSN, CDE Diabetes Coordinator Inpatient Diabetes Program Team Pager: [phone] (8a-10p)

## 2014-03-09 NOTE — Progress Notes (Signed)
Inpatient Diabetes Program Recommendations  AACE/ADA: New Consensus Statement on Inpatient Glycemic Control (2013)  Target Ranges:  Prepandial:   less than 140 mg/dL      Peak postprandial:   less than 180 mg/dL (1-2 hours)      Critically ill patients:  140 - 180 mg/dL     Results for Sarah ReeksMES, [name] (MRN [id number]) as of 03/09/2014 08:45  Ref. Range 03/08/2014 14:53 03/08/2014 15:56 03/08/2014 17:07 03/08/2014 21:44  Glucose-Capillary Latest Range: 70-99 mg/dL 865133 (H) 784135 (H) 696175 (H) 278 (H)    Results for Sarah ReeksMES, [name] (MRN [id number]) as of 03/09/2014 08:45  Ref. Range 03/09/2014 08:03  Glucose-Capillary Latest Range: 70-99 mg/dL 440303 (H)    Results for Sarah ReeksMES, [name] (MRN [id number]) as of 03/09/2014 08:45  Ref. Range 03/08/2014 06:30  Hemoglobin A1C Latest Range: 4.8-5.6 % 9.2 (H)     Chief Complaint: PE & Hyperglycemia  History: DM Type 2, HTN, Prior PE  Home DM Meds: Amaryl 4 mg bid         Metformin 1000 mg bid  Current DM Orders: Lantus 10 units daily    Novolog Sensitive SSI   **Note patient admitted with hyperglycemia.  Glucose 555 mg/dl on admission.  **Patient only on oral DM medications at home.  A1c 9.2% shows sub-optimal glucose control at home.  **Needs follow-up with her [facility]CP[facility]  **Note patient received 10 units Lantus yesterday at 2:41 pm to transition off IV insulin drip.  CBG this AM 303 mg/dl.    MD- Since fasting glucose elevated this AM, may want to consider increasing Lantus dose further to 18 units daily (0.15 units/kg dosing based on weight of 121 kg)  Please also consider increasing Novolog SSI to Moderate scale (currently ordered as Sensitive scale)  MD- Do you plan to discharge patient home on insulin??  If so, patient will need insulin education by bedside RN    Will follow Ambrose FinlandJeannine Johnston Latica Hohmann RN, MSN, CDE Diabetes Coordinator Inpatient Diabetes Program Team Pager: [phone] (8a-10p)

## 2014-03-09 NOTE — Progress Notes (Signed)
Received from SDU  Heparin drip ongoing@ 15u. Agree with previous RN's assessment.

## 2014-03-09 NOTE — Progress Notes (Signed)
ANTICOAGULATION CONSULT NOTE - F/u Consult  Pharmacy Consult for Heparin Indication: Rule out PE  No Known Allergies  Patient Measurements: Height: 5\' 10"  (177.8 cm) Weight: 266 lb 12.1 oz (121 kg) IBW/kg (Calculated) : 68.5 Heparin Dosing Weight: 93.8 kg  Vital Signs: Temp: 99.3 F (37.4 C) (03/02 1000) Temp Source: Core (Comment) (03/02 0800) BP: 123/75 mmHg (03/02 1000) Pulse Rate: 93 (03/02 1000)  Labs:  Recent Labs  03/07/14 1829 03/07/14 1830  03/08/14 0630 03/08/14 1300 03/09/14 0441  HGB 13.7  --   --  12.9  --  12.2  HCT 42.4  --   --  38.9  --  37.7  PLT 184  --   --  147*  --  158  APTT  --  27  --   --   --   --   LABPROT  --  16.3*  --   --   --   --   INR  --  1.30  --   --   --   --   HEPARINUNFRC  --   --   --  0.41 0.41 0.60  CREATININE 1.56*  --   < > 1.45* 1.13* 1.10  < > = values in this interval not displayed.  Estimated Creatinine Clearance: 68.2 mL/min (by C-G formula based on Cr of 1.1).   Assessment: 69 yo female who presents with increasing shortness of breath and RLE swelling over the past 7 days. She has history of PE several years ago for which she was previously on coumadin for 6 months. D-dimer >20, currently unable to receive IV contrast due to renal function. Pharmacy is consulted to dose IV heparin for presumed PE.  Significant events 3/1: BL duplex shows new acute R DVT, and small chronic L DVT (prob from 2010).  2-D echo consistent with PE  Today, 03/09/2014:  DHL therapeutic at 1500 units/hr, trending up  CBC wnl   No bleeding or line issues noted per nursing  AKI resolved   Goal of Therapy:  Heparin level 0.3-0.7 units/ml Monitor platelets by anticoagulation protocol: Yes   Plan:   Continue heparin infusion at 1500 units/hr  Daily heparin level and CBC  Monitor for signs of bleeding/clotting  Bernadene Personrew Anadia Helmes, PharmD Pager: [phone] 03/09/2014, 11:47 AM

## 2014-03-10 ENCOUNTER — Inpatient Hospital Stay (HOSPITAL_COMMUNITY): Payer: Medicare Other

## 2014-03-10 ENCOUNTER — Encounter (HOSPITAL_COMMUNITY): Payer: Self-pay | Admitting: Radiology

## 2014-03-10 LAB — GLUCOSE, CAPILLARY
GLUCOSE-CAPILLARY: 262 mg/dL — AB (ref 70–99)
Glucose-Capillary: 257 mg/dL — ABNORMAL HIGH (ref 70–99)
Glucose-Capillary: 264 mg/dL — ABNORMAL HIGH (ref 70–99)
Glucose-Capillary: 219 mg/dL — ABNORMAL HIGH (ref 70–99)

## 2014-03-10 LAB — BASIC METABOLIC PANEL
ANION GAP: 9 (ref 5–15)
BUN: 24 mg/dL — AB (ref 6–23)
CALCIUM: 8.2 mg/dL — AB (ref 8.4–10.5)
CO2: 20 mmol/L (ref 19–32)
CREATININE: 0.96 mg/dL (ref 0.50–1.10)
Chloride: 102 mmol/L (ref 96–112)
GFR calc non Af Amer: 59 mL/min — ABNORMAL LOW (ref >90)
GFR, EST AFRICAN AMERICAN: 68 mL/min — AB (ref >90)
Glucose, Bld: 237 mg/dL — ABNORMAL HIGH (ref 70–99)
Potassium: 4.3 mmol/L (ref 3.5–5.1)
Sodium: 131 mmol/L — ABNORMAL LOW (ref 135–145)

## 2014-03-10 LAB — CBC
HEMATOCRIT: 37.1 % (ref 36.0–46.0)
Hemoglobin: 12.2 g/dL (ref 12.0–15.0)
MCH: 30.7 pg (ref 26.0–34.0)
MCHC: 32.9 g/dL (ref 30.0–36.0)
MCV: 93.5 fL (ref 78.0–100.0)
Platelets: 139 10*3/uL — ABNORMAL LOW (ref 150–400)
RBC: 3.97 MIL/uL (ref 3.87–5.11)
RDW: 14 % (ref 11.5–15.5)
WBC: 12.3 10*3/uL — ABNORMAL HIGH (ref 4.0–10.5)

## 2014-03-10 LAB — HEPARIN LEVEL (UNFRACTIONATED): HEPARIN UNFRACTIONATED: 0.37 [IU]/mL (ref 0.30–0.70)

## 2014-03-10 MED ORDER — DEXTROSE 5 % IV SOLN
500.0000 mg | INTRAVENOUS | Status: DC
  Administered 2014-03-10 – 2014-03-12 (×3): 500 mg via INTRAVENOUS
  Filled 2014-03-10 (×4): qty 500

## 2014-03-10 MED ORDER — CEFTRIAXONE SODIUM IN DEXTROSE 20 MG/ML IV SOLN: 1.0000 g | INTRAVENOUS | Status: DC

## 2014-03-10 MED ORDER — WARFARIN VIDEO
Freq: Once | Status: AC
  Administered 2014-03-10: 12:00:00

## 2014-03-10 MED ORDER — WARFARIN - PHARMACIST DOSING INPATIENT
Freq: Every day | Status: DC
  Administered 2014-03-13: 18:00:00

## 2014-03-10 MED ORDER — ENOXAPARIN SODIUM 120 MG/0.8ML ~~LOC~~ SOLN
120.0000 mg | Freq: Two times a day (BID) | SUBCUTANEOUS | Status: DC
  Administered 2014-03-10 – 2014-03-14 (×10): 120 mg via SUBCUTANEOUS
  Filled 2014-03-10 (×11): qty 0.8

## 2014-03-10 MED ORDER — WARFARIN SODIUM 7.5 MG PO TABS
7.5000 mg | ORAL_TABLET | Freq: Once | ORAL | Status: AC
  Administered 2014-03-10: 7.5 mg via ORAL
  Filled 2014-03-10: qty 1

## 2014-03-10 MED ORDER — COUMADIN BOOK
Freq: Once | Status: AC
  Administered 2014-03-10: 12:00:00
  Filled 2014-03-10: qty 1

## 2014-03-10 MED ORDER — IOHEXOL 350 MG/ML SOLN
100.0000 mL | Freq: Once | INTRAVENOUS | Status: AC | PRN
  Administered 2014-03-10: 100 mL via INTRAVENOUS

## 2014-03-10 NOTE — Progress Notes (Addendum)
CT angio chest confirmed large saddle embolus, recommended cardiology consult. I placed request. Pt is already on Lovenox and pharmacy has been consulted to bridge with coumadin. CT notable for ? Infiltrate, with leukocytosis and low grade fever, will add Zithromax to Rocephin for now, sputum cultures requested.   Debbora PrestoMAGICK-Kalicia Dufresne, MD  [facility] Pager [phone]  If 7PM-7AM, please contact night-coverage [email or web address] Password [email or web address]

## 2014-03-10 NOTE — Progress Notes (Signed)
ANTICOAGULATION CONSULT NOTE - Follow Up Consult  Pharmacy Consult for Lovenox, Warfarin Indication: pulmonary embolus and DVT  No Known Allergies  Patient Measurements: Height: 5\' 10"  (177.8 cm) Weight: 266 lb 12.1 oz (121 kg) IBW/kg (Calculated) : 68.5 Heparin Dosing Weight: 96 kg  Vital Signs: Temp: 98.2 F (36.8 C) (03/03 0457) Temp Source: Oral (03/03 0457) BP: 115/62 mmHg (03/03 0457) Pulse Rate: 79 (03/03 0457)  Labs:  Recent Labs  03/07/14 1830  03/08/14 0630 03/08/14 1300 03/09/14 0441 03/10/14 0520  HGB  --   --  12.9  --  12.2 12.2  HCT  --   --  38.9  --  37.7 37.1  PLT  --   --  147*  --  158 139*  APTT 27  --   --   --   --   --   LABPROT 16.3*  --   --   --   --   --   INR 1.30  --   --   --   --   --   HEPARINUNFRC  --   < > 0.41 0.41 0.60 0.37  CREATININE  --   < > 1.45* 1.13* 1.10 0.96  < > = values in this interval not displayed.  Estimated Creatinine Clearance: 78.1 mL/min (by C-G formula based on Cr of 0.96).   Medications:  Infusions:  . heparin 1,500 Units/hr (03/09/14 1900)    Assessment: 69 yo female who presents with increasing shortness of breath and RLE swelling over the past 7 days. She has history of PE several years ago for which she was previously on coumadin for 6 months. D-dimer >20, currently unable to receive IV contrast due to renal function. Pharmacy was initially consulted to dose IV heparin for PE/DVT.  Pharmacy is now consulted to transition to Lovenox and Warfarin for discharge.  Today, 03/10/2014:  Heparin level 0.37, therapeutic  Baseline INR 1.3 (2/29)  CBC: Hgb remains stable/WNL, Plt 139 is decreased from baseline (184k on 2/29)  Diet: carb modified  Drug-drug interactions: ceftriaxone (broad spectrum abx may increase INR)   Goal of Therapy:  Heparin level 0.3-0.7 units/ml Monitor platelets by anticoagulation protocol: Yes   Plan:   D/C heparin infusion  Transition to Lovenox 120mg  SQ q12h (first dose  1 hour after heparin d/c)   Warfarin 7.5 mg PO today x1 at 1800  Daily INR, and CBC  Continue to monitor H&H and platelets  Warfarin book and video.  Pharmacy to provide warfarin patient education prior to discharge.   Lynann Beaverhristine Alexander Mcauley PharmD, BCPS Pager [phone] 03/10/2014 7:50 AM

## 2014-03-10 NOTE — Progress Notes (Addendum)
Patient ID: [name], female   DOB: 24-Feb-1945, 69 y.o.   MRN: [id number]  [facility] PROGRESS NOTE  [name] NWG:[id number] DOB: 11-29-1945 DOA: 03/07/2014 PCP: Cari Caraway, MD   Brief narrative:    Pt is 69 yo female who presented to [facility] ED with main concern of right lower extremity swelling that started 10 days prior to admission. Patient has tried compression stockings for several days with minimum improvement in swelling. She has developed progressive dyspnea with exertion and at rest. She was admitted for further evaluation, noted to have pulmonary embolus and was started on heparin drip.  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 - A1c 9.1 - Patient on metformin and glimepiride at home - Currently on Lantus 10 units with sliding scale insulin, will increase Lantus to 15 U for better CBG control  - Would avoid metformin for an now until renal function stabilizes for at least 72 hours  Acute on chronic kidney disease, 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 - Review of records indicate that in 2010 patient had GFR in 50's, her creatinine has however remained stable and within normal limits - Acute elevation in creatinine this admission likely secondary to dehydration, prerenal etiology - IV fluids have been provided and creatinine remains within normal limits  Hyponatremia - pre renal in etiology - encouraged PO intake and will repeat BMP in AM  HLD (hyperlipidemia) - Continue statin  Essential  hypertension - Reasonable inpatient control - continue Cardizem - lisinopril on hold until renal function stabilized at least for 72 hours   Hypothyroidism - Continue Synthroid  Morbid obesity - Patient meets criteria for morbid obesity given comorbid conditions including hypertension, hyperlipidemia, diabetes and BMI greater than 36 - Body mass index is 38.28 kg/(m^2)  SIRS secondary to UTI - criteria met with T 101.1 F, RR 23, WBC 11 K - started on rocephin 3/1 - continue same ABX and follow up on urine culture  - will also check what CT chest shows as PNA is possible as well and may need broad coverage   DVT prophylaxis - already on Heparin drip for DVT and PE  Code Status: Full.  Family Communication: plan of care discussed with the patient Disposition Plan: Transfer to telemetry bed   IV access:  Peripheral IV  Procedures and diagnostic studies:    Dg Chest [facility] 03/07/2014   No acute cardiopulmonary process seen.     Medical Consultants:  None  Other Consultants:  None  IAnti-Infectives:   Rocephin 3/1 -->   Faye Ramsay, MD  [facility] Pager [phone]  If 7PM-7AM, please contact night-coverage [email or web address] Password [facility] 03/10/2014, 11:51 AM   LOS: 3 days   HPI/Subjective: No events overnight.   Objective: Filed Vitals:   03/09/14 1000 03/09/14 1200 03/09/14 2122 03/10/14 0457  BP: 123/75 138/69 123/55 115/62  Pulse: 93 88 92 79  Temp: 99.3 F (37.4 C) 99 F (37.2 C) 98.1 F (36.7 C) 98.2 F (36.8 C)  TempSrc:   Oral Oral  Resp: _0 Height:      Weight:      SpO2: 95% 97% 97% 99%    Intake/Output Summary (Last 24 hours) at 03/10/14 1151 Last data filed at 03/10/14 0900  Gross per 24 hour  Intake    900 ml  Output    150 ml  Net    750 ml    Exam:   General:  Pt is alert, follows commands appropriately, not in acute distress  Cardiovascular: Regular rate and rhythm, S1/S2, no murmurs, no rubs, no  gallops  Respiratory: Clear to auscultation bilaterally, dyspnea with RR in 20's and diminished breath sounds at bases   Abdomen: Soft, non tender, non distended, bowel sounds present, no guarding  Extremities: pulses DP and PT palpable bilaterally  Neuro: Grossly nonfocal  Data Reviewed: Basic Metabolic Panel:  Recent Labs Lab 03/07/14 2242 03/08/14 0242 03/08/14 0630 03/08/14 1300 03/09/14 0441 03/09/14 1234 03/10/14 0520  NA 132* 134* 136 135 133*  --  131*  K 5.8* 6.5* 4.5 3.9 4.7  --  4.3  CL 100 104 108 107 105  --  102  CO2 17* 15* _1 --  20  GLUCOSE 512* 476* 329* 123* 286* 385* 237*  BUN 31* 32* 32* 29* 28*  --  24*  CREATININE 1.40* 1.35* 1.45* 1.13* 1.10  --  0.96  CALCIUM 8.7 8.7 8.4 8.1* 8.2*  --  8.2*  MG 2.1  --   --   --   --   --   --    Liver Function Tests: No results for input(s): AST, ALT, ALKPHOS, BILITOT, PROT, ALBUMIN in the last 168 hours. No results for input(s): LIPASE, AMYLASE in the last 168 hours. No results for input(s): AMMONIA in the last 168 hours. CBC:  Recent Labs Lab 03/07/14 1829 03/08/14 0630 03/09/14 0441 03/10/14 0520  WBC 8.7 7.1 11.8* 12.3*  HGB 13.7 12.9 12.2 12.2  HCT 42.4 38.9 37.7 37.1  MCV 94.4 92.0 93.1 93.5  PLT 184 147* 158 139*   Cardiac Enzymes: No results for input(s): CKTOTAL, CKMB, CKMBINDEX, TROPONINI in the last 168 hours. BNP: Invalid input(s): POCBNP CBG:  Recent Labs Lab 03/09/14 0803 03/09/14 1148 03/09/14 1750 03/09/14 2135 03/10/14 0745  GLUCAP 303* 404* 296* 205* 257*    Recent Results (from the past 240 hour(s))  MRSA PCR Screening     Status: None   Collection Time: 03/07/14 10:50 PM  Result Value Ref Range Status   MRSA by PCR NEGATIVE NEGATIVE Final    Comment:        The GeneXpert MRSA Assay (FDA approved for NASAL specimens only), is one component of a comprehensive MRSA colonization surveillance program. It is not intended to diagnose MRSA infection nor to guide  or monitor treatment for MRSA infections.   Urine culture     Status: None   Collection Time: 03/08/14  1:08 PM  Result Value Ref Range Status   Specimen Description URINE, CATHETERIZED  Final   Special Requests NONE  Final   Colony Count NO GROWTH Performed at [facility]   Final   Culture NO GROWTH Performed at [facility]   Final   Report Status 03/09/2014 FINAL  Final     Scheduled Meds: . atorvastatin  20 mg Oral Daily  . cefTRIAXone (ROCEPHIN)  IV  1 g Intravenous Q24H  . coumadin book   Does not apply Once  . diltiazem  180 mg Oral Daily  . enoxaparin (LOVENOX) injection  120 mg Subcutaneous Q12H  . insulin aspart  0-20 Units Subcutaneous TID WC  . insulin aspart  3 Units Subcutaneous TID WC  . insulin glargine  15 Units Subcutaneous Daily  . levothyroxine  112 mcg Oral QAC breakfast  . sodium chloride  10-40 mL Intracatheter Q12H  . sodium chloride  3 mL Intravenous Q12H  . warfarin  7.5 mg Oral ONCE-1800  . warfarin   Does not apply Once  . Warfarin - Pharmacist Dosing Inpatient   Does not apply q1800   Continuous Infusions:

## 2014-03-11 LAB — GLUCOSE, CAPILLARY
GLUCOSE-CAPILLARY: 179 mg/dL — AB (ref 70–99)
GLUCOSE-CAPILLARY: 240 mg/dL — AB (ref 70–99)
Glucose-Capillary: 254 mg/dL — ABNORMAL HIGH (ref 70–99)
Glucose-Capillary: 187 mg/dL — ABNORMAL HIGH (ref 70–99)

## 2014-03-11 LAB — PROTIME-INR
INR: 1.31 (ref 0.00–1.49)
Prothrombin Time: 16.4 seconds — ABNORMAL HIGH (ref 11.6–15.2)

## 2014-03-11 LAB — CBC
HEMATOCRIT: 33.3 % — AB (ref 36.0–46.0)
Hemoglobin: 11.1 g/dL — ABNORMAL LOW (ref 12.0–15.0)
MCH: 30.4 pg (ref 26.0–34.0)
MCHC: 33.3 g/dL (ref 30.0–36.0)
MCV: 91.2 fL (ref 78.0–100.0)
Platelets: 140 10*3/uL — ABNORMAL LOW (ref 150–400)
RBC: 3.65 MIL/uL — AB (ref 3.87–5.11)
RDW: 13.9 % (ref 11.5–15.5)
WBC: 9.7 10*3/uL (ref 4.0–10.5)

## 2014-03-11 LAB — LEGIONELLA ANTIGEN, URINE

## 2014-03-11 LAB — STREP PNEUMONIAE URINARY ANTIGEN: Strep Pneumo Urinary Antigen: NEGATIVE

## 2014-03-11 MED ORDER — WARFARIN SODIUM 7.5 MG PO TABS
7.5000 mg | ORAL_TABLET | Freq: Once | ORAL | Status: AC
  Administered 2014-03-11: 7.5 mg via ORAL
  Filled 2014-03-11: qty 1

## 2014-03-11 MED ORDER — INSULIN ASPART 100 UNIT/ML ~~LOC~~ SOLN
5.0000 [IU] | Freq: Three times a day (TID) | SUBCUTANEOUS | Status: DC
  Administered 2014-03-11 – 2014-03-15 (×12): 5 [IU] via SUBCUTANEOUS

## 2014-03-11 MED ORDER — INSULIN GLARGINE 100 UNIT/ML ~~LOC~~ SOLN
18.0000 [IU] | Freq: Every day | SUBCUTANEOUS | Status: DC
  Administered 2014-03-12 – 2014-03-15 (×4): 18 [IU] via SUBCUTANEOUS
  Filled 2014-03-11 (×4): qty 0.18

## 2014-03-11 NOTE — Discharge Instructions (Signed)
Information on my medicine - Coumadin   (Warfarin)  This medication education was reviewed with me or my healthcare representative as part of my discharge preparation.  The pharmacist that spoke with me during my hospital stay was:  Reece Packerickering, Less [place] Robert, [facility]  Why was Coumadin prescribed for you? Coumadin was prescribed for you because you have a blood clot or a medical condition that can cause an increased risk of forming blood clots. Blood clots can cause serious health problems by blocking the flow of blood to the heart, lung, or brain. Coumadin can prevent harmful blood clots from forming. As a reminder your indication for Coumadin is:   Pulmonary Embolism Treatment  What test will check on my response to Coumadin? While on Coumadin (warfarin) you will need to have an INR test regularly to ensure that your dose is keeping you in the desired range. The INR (international normalized ratio) number is calculated from the result of the laboratory test called prothrombin time (PT).  If an INR APPOINTMENT HAS NOT ALREADY BEEN MADE FOR YOU please schedule an appointment to have this lab work done by your health care provider within 7 days. Your INR goal is usually a number between:  2 to 3 or your provider may give you a more narrow range like 2-2.5.  Ask your health care provider during an office visit what your goal INR is.  What  do you need to  know  About  COUMADIN? Take Coumadin (warfarin) exactly as prescribed by your healthcare provider about the same time each day.  DO NOT stop taking without talking to the doctor who prescribed the medication.  Stopping without other blood clot prevention medication to take the place of Coumadin may increase your risk of developing a new clot or stroke.  Get refills before you run out.  What do you do if you miss a dose? If you miss a dose, take it as soon as you remember on the same day then continue your regularly scheduled regimen the next day.  Do not  take two doses of Coumadin at the same time.  Important Safety Information A possible side effect of Coumadin (Warfarin) is an increased risk of bleeding. You should call your healthcare provider right away if you experience any of the following: ? Bleeding from an injury or your nose that does not stop. ? Unusual colored urine (red or dark brown) or unusual colored stools (red or black). ? Unusual bruising for unknown reasons. ? A serious fall or if you hit your head (even if there is no bleeding).  Some foods or medicines interact with Coumadin (warfarin) and might alter your response to warfarin. To help avoid this: ? Eat a balanced diet, maintaining a consistent amount of Vitamin K. ? Notify your provider about major diet changes you plan to make. ? Avoid alcohol or limit your intake to 1 drink for women and 2 drinks for men per day. (1 drink is 5 oz. wine, 12 oz. beer, or 1.5 oz. liquor.)  Make sure that ANY health care provider who prescribes medication for you knows that you are taking Coumadin (warfarin).  Also make sure the healthcare provider who is monitoring your Coumadin knows when you have started a new medication including herbals and non-prescription products.  Coumadin (Warfarin)  Major Drug Interactions  Increased Warfarin Effect Decreased Warfarin Effect  Alcohol (large quantities) Antibiotics (esp. Septra/Bactrim, Flagyl, Cipro) Amiodarone (Cordarone) Aspirin (ASA) Cimetidine (Tagamet) Megestrol (Megace) NSAIDs (ibuprofen, naproxen, etc.) Piroxicam (  Feldene) °Propafenone (Rythmol SR) °Propranolol (Inderal) °Isoniazid (INH) °Posaconazole (Noxafil) Barbiturates (Phenobarbital) °Carbamazepine (Tegretol) °Chlordiazepoxide (Librium) °Cholestyramine (Questran) °Griseofulvin °Oral Contraceptives °Rifampin °Sucralfate (Carafate) °Vitamin K  ° °Coumadin® (Warfarin) Major Herbal Interactions  °Increased Warfarin Effect Decreased Warfarin Effect  °Garlic °Ginseng °Ginkgo biloba  Coenzyme Q10 °Green tea °St. John’s wort   ° °Coumadin® (Warfarin) FOOD Interactions  °Eat a consistent number of servings per week of foods HIGH in Vitamin K °(1 serving = ½ cup)  °Collards (cooked, or boiled & drained) °Kale (cooked, or boiled & drained) °Mustard greens (cooked, or boiled & drained) °Parsley *serving size only = ¼ cup °Spinach (cooked, or boiled & drained) °Swiss chard (cooked, or boiled & drained) °Turnip greens (cooked, or boiled & drained)  °Eat a consistent number of servings per week of foods MEDIUM-HIGH in Vitamin K °(1 serving = 1 cup)  °Asparagus (cooked, or boiled & drained) °Broccoli (cooked, boiled & drained, or raw & chopped) °Brussel sprouts (cooked, or boiled & drained) *serving size only = ½ cup °Lettuce, raw (green leaf, endive, romaine) °Spinach, raw °Turnip greens, raw & chopped  ° °These websites have more information on Coumadin (warfarin):  www.coumadin.com; °www.ahrq.gov/consumer/coumadin.htm; ° ° ° °

## 2014-03-11 NOTE — Progress Notes (Signed)
Inpatient Diabetes Program Recommendations  AACE/ADA: New Consensus Statement on Inpatient Glycemic Control (2013)  Target Ranges:  Prepandial:   less than 140 mg/dL      Peak postprandial:   less than 180 mg/dL (1-2 hours)      Critically ill patients:  140 - 180 mg/dL    Results for [name], [name] (MRN [id number]) as of 03/11/2014 12:01  Ref. Range 03/10/2014 07:45 03/10/2014 12:17 03/10/2014 16:54 03/10/2014 21:39  Glucose-Capillary Latest Range: 70-99 mg/dL 409257 (H) 811264 (H) 914219 (H) 262 (H)    Results for [name], [name] (MRN [id number]) as of 03/11/2014 12:01  Ref. Range 03/11/2014 07:34 03/11/2014 11:39  Glucose-Capillary Latest Range: 70-99 mg/dL 086179 (H) 578254 (H)     Chief Complaint: PE & Hyperglycemia  History: DM Type 2, HTN, Prior PE  Home DM Meds: Amaryl 4 mg bid  Metformin 1000 mg bid  Current DM Orders: Lantus 15 units daily Novolog Sensitive SSI    Novolog 3 units tidwc    MD- Patient's fasting glucose somewhat improved today.  Still elevated above goal however.  Patient also still having issues with postprandial glucose elevations.  Please consider the following:  1. Increase Lantus to 18 units daily  2. Increase Novolog meal coverage to 5 units tidwc    MD, if you decide to send patient home on insulin, please give patient Rxs for for insulin pens and insulin pen needles.   Lantus Solostar insulin pen [Order # [id number]]  Insulin pen needles- 31 gauge x 5mm [Order # [id number]]  Novolog Flexpen (if you decide to send home on Novolog as well) [Order # [id number]]    Will follow Ambrose FinlandJeannine Johnston Milisa Kimbell RN, MSN, CDE Diabetes Coordinator Inpatient Diabetes Program Team Pager: [phone] (8a-10p)

## 2014-03-11 NOTE — Progress Notes (Addendum)
Patient ID: [name], female   DOB: 05-Jan-1946, 69 y.o.   MRN: [id number]  [facility] PROGRESS NOTE  [name] HKV:[id number] DOB: [birth year] DOA: 03/07/2014 PCP: Cari Caraway, MD   Brief narrative:    Pt is 69 yo female who presented to [facility] ED with main concern of right lower extremity swelling that started 10 days prior to admission. Patient has tried compression stockings for several days with minimum improvement in swelling. She has developed progressive dyspnea with exertion and at rest. She was admitted for further evaluation, noted to have pulmonary embolus and was started on heparin drip.  Assessment/Plan:    Principal Problem:  Acute respiratory failure with hypoxia on admission, oxygen saturation 88-90% on room air on admission - Secondary to acute pulmonary embolus and possibly underlying PNA - CT angio chest confirms large saddle embolus  - continue Lovenox with bridging to Coumadin  - pt reports feeling better is looks clinically stable this AM  Acute DVT, right lower extremity - AC as noted above   Pneumonia, CAP, lobar, inferior lingula, unknown pathogen  - noted on CT chest - added Zithromax 3/3 to rocephin that was already started on 3/1 for UTI but should be also adequate for CAP coverage  - pt remains afebrile over the past 48 hours and WNL is now WNL  Active Problems:  Diabetes mellitus, type II, 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 - A1c 9.1 - Patient on metformin and glimepiride at home - Currently on Lantus 15 U, will increase to 18 U - appreciate diabetic educator input   Acute on chronic kidney disease, 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 - Review of records indicate that in 2010 patient had GFR in 50's, her creatinine has however remained stable and within normal limits - Acute elevation in creatinine this admission likely secondary to dehydration, prerenal etiology - IV fluids have been provided and creatinine remains within normal limits   Hyponatremia -  pre renal in etiology - encouraged PO intake and will repeat BMP in AM  HLD (hyperlipidemia) - Continue statin  Essential hypertension - Reasonable inpatient control - continue Cardizem - lisinopril on hold but can likely be resumed upon discharge   Hypothyroidism - Continue Synthroid  Morbid obesity - Patient meets criteria for morbid obesity given comorbid conditions including hypertension, hyperlipidemia, diabetes and BMI greater than 36 - Body mass index is 38.28 kg/(m^2)  Sepsis secondary to UTI, CAP - criteria met with T 101.1 F, RR 23, WBC 11 K on admission  - started on rocephin 3/1 - since CT chest on 3/3 notable for possible infiltrate as noted above, Zithromax was added to the regimen 3/3 - continue both ABX for now  - WBC is now WNL, pt remains afebrile with RR at target range   DVT prophylaxis - already on [facility] with Coumadin, INR 1.31   Code Status: Full.  Family Communication: plan of care discussed with the patient Disposition Plan: Possible d/c in 1-2 days   IV access:  Peripheral IV  Procedures and diagnostic studies:    Ct Angio Chest Pe W/cm &/or Wo Cm    03/10/2014   Positive for extensive acute PE with CT evidence of right heart strain (RV/LV Ratio = Positive for acute PE with CT evidence of right heart strain (RV/LV Ratio = 2.0) consistent with at least submassive (intermediate risk)PE. The presence of right heart strain has been associated with anincreased risk of morbidity and mortality. Consultation with Caroline is recommended.) consistent  with at least submassive (intermediate risk) PE. The presence of right heart strain has been associated with an increased risk of morbidity and mortality.  Small area of infiltrate inferior lingula. Bilateral pleural effusions with patchy areas of atelectatic change.  Visualized thyroid diffusely enlarged.   Medical Consultants:  Cardiology  Other Consultants:  None  IAnti-Infectives:    Rocephin 3/1 --> Zithromax 3/3 -->   Faye Ramsay, MD  [facility] Pager [phone]  If 7PM-7AM, please contact night-coverage [email or web address] Password [email or web address] 03/11/2014, 3:31 PM   LOS: 4 days   HPI/Subjective: No events overnight.   Objective: Filed Vitals:   03/10/14 1341 03/10/14 2144 03/11/14 0426 03/11/14 1442  BP: 137/53 125/68 113/55 132/55  Pulse: 80 91 79 79  Temp: 97.8 F (36.6 C) 97.4 F (36.3 C) 98.1 F (36.7 C) 98 F (36.7 C)  TempSrc: Oral Oral Oral Oral  Resp: _0 Height:      Weight:      SpO2: 98% 100% 97% 98%    Intake/Output Summary (Last 24 hours) at 03/11/14 1531 Last data filed at 03/11/14 1224  Gross per 24 hour  Intake    503 ml  Output    350 ml  Net    153 ml    Exam:   General:  Pt is alert, follows commands appropriately, not in acute distress  Cardiovascular: Regular rate and rhythm, S1/S2, no murmurs, no rubs, no gallops  Respiratory: Clear to auscultation bilaterally, mild rhonchi at bases with diminished breath sound bilaterally   Abdomen: Soft, non tender, non distended, bowel sounds present, no guarding  Extremities: pulses DP and PT palpable bilaterally  Neuro: Grossly nonfocal  Data Reviewed: Basic Metabolic Panel:  Recent Labs Lab 03/07/14 2242 03/08/14 0242 03/08/14 0630 03/08/14 1300 03/09/14 0441 03/09/14 1234 03/10/14 0520  NA 132* 134* 136 135 133*  --  131*  K 5.8* 6.5* 4.5 3.9 4.7  --  4.3  CL 100 104 108 107 105  --  102  CO2 17* 15* _1 --  20  GLUCOSE 512* 476* 329* 123* 286* 385* 237*  BUN 31* 32* 32* 29* 28*  --  24*  CREATININE 1.40* 1.35* 1.45* 1.13* 1.10  --  0.96  CALCIUM 8.7 8.7 8.4 8.1* 8.2*  --  8.2*  MG 2.1  --   --   --   --   --   --    CBC:  Recent Labs Lab 03/07/14 1829 03/08/14 0630 03/09/14 0441 03/10/14 0520 03/11/14 0645  WBC 8.7 7.1 11.8* 12.3* 9.7  HGB 13.7 12.9 12.2 12.2 11.1*  HCT 42.4 38.9 37.7 37.1 33.3*  MCV 94.4 92.0 93.1 93.5 91.2  PLT 184 147*  158 139* 140*   CBG:  Recent Labs Lab 03/10/14 1217 03/10/14 1654 03/10/14 2139 03/11/14 0734 03/11/14 1139  GLUCAP 264* 219* 262* 179* 254*    Recent Results (from the past 240 hour(s))  MRSA PCR Screening     Status: None   Collection Time: 03/07/14 10:50 PM  Result Value Ref Range Status   MRSA by PCR NEGATIVE NEGATIVE Final  Urine culture     Status: None   Collection Time: 03/08/14  1:08 PM  Result Value Ref Range Status   Specimen Description URINE, CATHETERIZED  Final   Special Requests NONE  Final   Colony Count NO GROWTH Performed at [facility]   Final   Culture NO GROWTH Performed at [facility]  Final   Report Status 03/09/2014 FINAL  Final     Scheduled Meds: . atorvastatin  20 mg Oral Daily  . azithromycin  500 mg Intravenous Q24H  . cefTRIAXone (ROCEPHIN)  IV  1 g Intravenous Q24H  . diltiazem  180 mg Oral Daily  . enoxaparin (LOVENOX) injection  120 mg Subcutaneous Q12H  . insulin aspart  0-20 Units Subcutaneous TID WC  . insulin aspart  3 Units Subcutaneous TID WC  . insulin glargine  15 Units Subcutaneous Daily  . levothyroxine  112 mcg Oral QAC breakfast  . sodium chloride  10-40 mL Intracatheter Q12H  . sodium chloride  3 mL Intravenous Q12H  . warfarin  7.5 mg Oral ONCE-1800  . Warfarin - Pharmacist Dosing Inpatient   Does not apply q1800   Continuous Infusions:

## 2014-03-11 NOTE — Care Management Note (Addendum)
    Page 1 of 2   03/15/2014     2:54:27 PM CARE MANAGEMENT NOTE 03/15/2014  Patient:  Mccasland,Estrellita N   Account Number:  [id number]  Date Initiated:  03/08/2014  Documentation initiated by:  DAVIS,RHONDA  Subjective/Objective Assessment:   pe and dvt confirmed     Action/Plan:   home when stable hx of same in 2010   Anticipated DC Date:  03/15/2014   Anticipated DC Plan:  HOME W HOME HEALTH SERVICES      DC Planning Services  CM consult      Choice offered to / List presented to:  C-1 Patient        HH arranged  HH-1 RN  HH-2 PT  HH-4 NURSE'S AIDE      HH agency  [facility]   Status of service:  Completed, signed off Medicare Important Message given?  YES (If response is "NO", the following Medicare IM given date fields will be blank) Date Medicare IM given:  03/14/2014 Medicare IM given by:  [facility]Hong Timm Date Additional Medicare IM given:   Additional Medicare IM given by:    Discharge Disposition:  HOME W HOME HEALTH SERVICES  Per UR Regulation:  Reviewed for med. necessity/level of care/duration of stay  If discussed at Long Length of Stay Meetings, dates discussed:   03/15/2014    Comments:  03/15/14 Lanier ClamKathy Caylin Raby RN BSN NCM [phone] [facility] rep Judeth CornfieldStephanie aware of HHRN/HHPT/Nurse's aide orders. No further d/c needs. [facility] already following.Awaiting HHC orders.HHRN-can provide lab draws & will send results to pcp who will manage coumadin.TC pcp office [facility]Dr. Gweneth DimitriWendy McNeill [phone] will manage coumadin-pt/inr checks due Thurs 03/17/14.MD notified.  03/14/14 Lanier ClamKathy Raziyah Vanvleck RN BSN NCm [phone] [facility]. TC Stephanie rep aware of hhc orders, & potential d/c. Will need date of next lab draw.  03/11/14 Lanier ClamKathy Arlett Goold RN BSN NCM [phone] saddle PE, dvt-lovenox/coumadin bridge. leukocytosis-iv abx.d/c plan home.No anticipated d/c needs.  Rhonda Davis,RN,BSn,CCM

## 2014-03-11 NOTE — Progress Notes (Signed)
Diabetes Education Session   Met with patient to provide follow-up diabetes management education.  Pt able to successfully demonstrate how to check CBG using hospital meter and use of insulin pen.  Pt stated she does not currently check her CBG routinely at home and was encouraged to do so more often if taking insulin at home.  Normal values were discussed with emphasis on hypoglycemia and proper treatment since pt could not articulate symptoms or value.  Written guidelines were provided and pt able to verbalize value indicating hypoglycemia, options for 15g carb to treat, when to recheck CBG, and need for snack or meal after treatment to maintain blood glucose above 70.  Sick day rules discussed were need to stay hydrated, consumption of carbohydrate containing liquids if unable to tolerate solid foods, and possibility of need to decrease insulin (do not skip dose) if unable to take anything by mouth.  Lantus guidelines included need to take same dose at same time every day while Novolog may be different doses at meal times.  No family present for teaching although pt stated she planned to involve her husband at home.  All questions answered to patient's satisfaction.        Barnabas Lister, RN, Administrator MSN Education Student  Wyn Quaker RN, MSN, CDE Diabetes Coordinator Inpatient Diabetes Program Team Pager: [phone] (8a-10p)

## 2014-03-11 NOTE — Progress Notes (Signed)
ANTICOAGULATION CONSULT NOTE - Follow Up  Pharmacy Consult for Lovenox, Warfarin Indication: pulmonary embolus and DVT  No Known Allergies  Patient Measurements: Height: 5\' 10"  (177.8 cm) Weight: 266 lb 12.1 oz (121 kg) IBW/kg (Calculated) : 68.5 Heparin Dosing Weight: 96 kg  Vital Signs: Temp: 98.1 F (36.7 C) (03/04 0426) Temp Source: Oral (03/04 0426) BP: 113/55 mmHg (03/04 0426) Pulse Rate: 79 (03/04 0426)  Labs:  Recent Labs  03/08/14 1300  03/09/14 0441 03/10/14 0520 03/11/14 0645  HGB  --   < > 12.2 12.2 11.1*  HCT  --   --  37.7 37.1 33.3*  PLT  --   --  158 139* 140*  LABPROT  --   --   --   --  16.4*  INR  --   --   --   --  1.31  HEPARINUNFRC 0.41  --  0.60 0.37  --   CREATININE 1.13*  --  1.10 0.96  --   < > = values in this interval not displayed.  Estimated Creatinine Clearance: 78.1 mL/min (by C-G formula based on Cr of 0.96).   Medications:  Infusions:     Assessment: 69 yo female who presents with increasing shortness of breath and RLE swelling over the past 7 days. She has history of PE several years ago for which she was previously on Coumadin for 6 months. D-dimer >20, currently unable to receive IV contrast due to renal function. Pharmacy was initially consulted to dose IV heparin for PE.   Significant events 3/1: BL duplex shows new acute R DVT, and small chronic L DVT (prob from 2010). 2-D echo consistent with PE. 3/2: HL therapeutic. 3/3: CTA revealed large saddle PE. Heparin switched to Lovenox and started Coumadin w/ 7.5mg . Baseline INR 1.3.  Today, 03/11/2014:  INR 1.31, not responding yet.   CBC: Hgb trended down. Pltc slightly low. (184k on 2/29).  No bleeding reported/documented.  SCr stable, CrCl 78.  Diet: carb modified - tolerating  Drug-drug interactions: ceftriaxone and azithromycin (broad spectrum abx may increase INR)   Goal of Therapy:  INR 2-3 Heparin level 0.3-0.7 units/ml Monitor platelets by anticoagulation  protocol: Yes   Plan:   Cont Lovenox 120mg  SQ q12h.   Repeat Coumadin 7.5mg  PO today at 1800.  Overlap day 2 of 5 as recommended by CHEST guidelines.  Daily INR, and CBC.  Continue to monitor H&H and platelets.  Educated Ms. Bevacqua on Coumadin therapy. All questions were answered.   Charolotte Ekeom Kevon Tench, PharmD, pager [phone]. 03/11/2014,8:49 AM.

## 2014-03-12 ENCOUNTER — Inpatient Hospital Stay (HOSPITAL_COMMUNITY): Payer: Medicare Other

## 2014-03-12 ENCOUNTER — Encounter (HOSPITAL_COMMUNITY): Payer: Self-pay | Admitting: Radiology

## 2014-03-12 LAB — BASIC METABOLIC PANEL
Anion gap: 8 (ref 5–15)
BUN: 15 mg/dL (ref 6–23)
CHLORIDE: 101 mmol/L (ref 96–112)
CO2: 26 mmol/L (ref 19–32)
Calcium: 8.3 mg/dL — ABNORMAL LOW (ref 8.4–10.5)
Creatinine, Ser: 0.83 mg/dL (ref 0.50–1.10)
GFR calc non Af Amer: 70 mL/min — ABNORMAL LOW (ref >90)
GFR, EST AFRICAN AMERICAN: 82 mL/min — AB (ref >90)
Glucose, Bld: 148 mg/dL — ABNORMAL HIGH (ref 70–99)
Potassium: 4 mmol/L (ref 3.5–5.1)
Sodium: 135 mmol/L (ref 135–145)

## 2014-03-12 LAB — GLUCOSE, CAPILLARY
Glucose-Capillary: 160 mg/dL — ABNORMAL HIGH (ref 70–99)
Glucose-Capillary: 191 mg/dL — ABNORMAL HIGH (ref 70–99)
Glucose-Capillary: 180 mg/dL — ABNORMAL HIGH (ref 70–99)
Glucose-Capillary: 172 mg/dL — ABNORMAL HIGH (ref 70–99)

## 2014-03-12 LAB — PROTIME-INR
INR: 1.3 (ref 0.00–1.49)
PROTHROMBIN TIME: 16.3 s — AB (ref 11.6–15.2)

## 2014-03-12 LAB — CBC
HCT: 34.5 % — ABNORMAL LOW (ref 36.0–46.0)
Hemoglobin: 11.3 g/dL — ABNORMAL LOW (ref 12.0–15.0)
MCH: 30.2 pg (ref 26.0–34.0)
MCHC: 32.8 g/dL (ref 30.0–36.0)
MCV: 92.2 fL (ref 78.0–100.0)
PLATELETS: 138 10*3/uL — AB (ref 150–400)
RBC: 3.74 MIL/uL — ABNORMAL LOW (ref 3.87–5.11)
RDW: 14.2 % (ref 11.5–15.5)
WBC: 10.6 10*3/uL — AB (ref 4.0–10.5)

## 2014-03-12 MED ORDER — IOHEXOL 300 MG/ML  SOLN
100.0000 mL | Freq: Once | INTRAMUSCULAR | Status: AC | PRN
  Administered 2014-03-12: 100 mL via INTRAVENOUS

## 2014-03-12 MED ORDER — IOHEXOL 300 MG/ML  SOLN
50.0000 mL | Freq: Once | INTRAMUSCULAR | Status: AC | PRN
  Administered 2014-03-12: 50 mL via ORAL

## 2014-03-12 MED ORDER — WARFARIN SODIUM 2.5 MG PO TABS
12.5000 mg | ORAL_TABLET | Freq: Once | ORAL | Status: AC
  Administered 2014-03-12: 12.5 mg via ORAL
  Filled 2014-03-12: qty 1

## 2014-03-12 NOTE — Progress Notes (Signed)
ANTICOAGULATION CONSULT NOTE - Follow Up  Pharmacy Consult for Lovenox, Warfarin Indication: pulmonary embolus and DVT  No Known Allergies  Patient Measurements: Height: 5\' 10"  (177.8 cm) Weight: 266 lb 12.1 oz (121 kg) IBW/kg (Calculated) : 68.5 Heparin Dosing Weight: 96 kg  Vital Signs: Temp: 98 F (36.7 C) (03/05 0519) Temp Source: Oral (03/05 0519) BP: 106/60 mmHg (03/05 0519) Pulse Rate: 71 (03/05 0519)  Labs:  Recent Labs  03/10/14 0520 03/11/14 0645 03/12/14 0420  HGB 12.2 11.1* 11.3*  HCT 37.1 33.3* 34.5*  PLT 139* 140* 138*  LABPROT  --  16.4* 16.3*  INR  --  1.31 1.30  HEPARINUNFRC 0.37  --   --   CREATININE 0.96  --  0.83    Estimated Creatinine Clearance: 90.4 mL/min (by C-G formula based on Cr of 0.83).   Medications:  Infusions:     Assessment: 69 yo female who presents with increasing shortness of breath and RLE swelling over the past 7 days. She has history of PE several years ago for which she was previously on Coumadin for 6 months. D-dimer >20, currently unable to receive IV contrast due to renal function. Pharmacy was initially consulted to dose IV heparin for PE.   Significant events 3/1: BL duplex shows new acute R DVT, and small chronic L DVT (prob from 2010). 2-D echo consistent with PE. 3/2: HL therapeutic. 3/3: CTA revealed large saddle PE. Heparin switched to Lovenox and started Coumadin w/ 7.5mg . Baseline INR 1.3. 3/4: INR 1.31. Coumadin 7.5mg   Today, 03/12/2014:  INR 1.3, still not responding after two doses.   CBC: Hgb stable. Pltc slightly low, stable. (184k on 2/29).  No bleeding reported/documented.  SCr stable, CrCl 90  Diet: carb modified - tolerating  Drug-drug interactions: ceftriaxone and azithromycin (broad spectrum abx may increase INR)   Goal of Therapy:  INR 2-3 Heparin level 0.3-0.7 units/ml Monitor platelets by anticoagulation protocol: Yes   Plan:   Cont Lovenox 120mg  SQ q12h.   Give Coumadin 12.5mg   PO today, early at 1200.  Overlap day 3 of 5 as recommended by CHEST guidelines.  Daily INR, and CBC.  Continue to monitor H&H and platelets.  Charolotte Ekeom Zigmund Linse, PharmD, pager [phone]. 03/12/2014,9:37 AM.

## 2014-03-12 NOTE — Progress Notes (Signed)
Patient ID: [name], female   DOB: 1945/06/21, 69 y.o.   MRN: [id number]  [facility] PROGRESS NOTE  CHIA MOWERS JDY:[id number] DOB: 06/03/1945 DOA: 03/07/2014 PCP: Cari Caraway, MD  Brief narrative:    Pt is 69 yo female who presented to [facility] ED with main concern of right lower extremity swelling that started 10 days prior to admission. Patient has tried compression stockings for several days with minimum improvement in swelling. She has developed progressive dyspnea with exertion and at rest. She was admitted for further evaluation, noted to have pulmonary embolus and was started on heparin drip.  Assessment/Plan:    Principal Problem:  Acute respiratory failure with hypoxia on admission, oxygen saturation 88-90% on room air on admission - Secondary to acute pulmonary embolus and possibly underlying PNA noted on CT chest 3/3 - CT angio chest confirms large saddle embolus  - continue Lovenox with bridging to Coumadin  - pt reports feeling better is looks clinically stable this AM  Acute DVT, right lower extremity - AC as noted above  Pneumonia, CAP, lobar, inferior lingula, unknown pathogen  - noted on CT chest - added Zithromax 3/3 to rocephin that was already started on 3/1 for UTI but should be also adequate for CAP coverage  - pt remains afebrile over the past 48 hours and WNL overall trending down  Active Problems:   Abd pain with tenderness on exam - proceed with CT abd and pelvis for further evaluation   Diabetes mellitus, type II, 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 - A1c 9.1 - Patient on metformin and glimepiride at home - Currently on Lantus 18 U - appreciate diabetic educator input   Acute on chronic kidney disease, 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 - Review of records indicate that in 2010 patient had GFR in 50's, her creatinine has however remained stable and within normal limits - Acute elevation in creatinine this admission likely secondary to dehydration, prerenal  etiology - IV fluids have been provided and creatinine remains within normal limits   Hyponatremia - pre renal in etiology - Na is WNL this AM  HLD (hyperlipidemia) - Continue statin  Essential hypertension - Reasonable inpatient control - continue Cardizem - lisinopril on hold but can likely be resumed upon discharge   Hypothyroidism - Continue Synthroid  Morbid obesity - Patient meets criteria for morbid obesity given comorbid conditions including hypertension, hyperlipidemia, diabetes and BMI greater than 36 - Body mass index is 38.28 kg/(m^2)  Sepsis secondary to UTI, CAP - criteria met with T 101.1 F, RR 23, WBC 11 K on admission  - started on rocephin 3/1 - since CT chest on 3/3 notable for possible infiltrate as noted above, Zithromax was added to the regimen 3/3 - continue both ABX for now  - WBC is now WNL, pt remains afebrile with RR at target range   DVT prophylaxis - already on [facility] with Coumadin, INR 1.31   Code Status: Full.  Family Communication: plan of care discussed with the patient Disposition Plan: D/C when INR is therapeutic   IV access:  Peripheral IV  Procedures and diagnostic studies:    Ct Angio Chest Pe W/cm &/or Wo Cm 03/10/2014 Positive for extensive acute PE with CT evidence of right heart strain (RV/LV Ratio = Positive for acute PE with CT evidence of right heart strain (RV/LV Ratio = 2.0) consistent with at least submassive (intermediate risk)PE. The presence of right heart strain has been associated with anincreased risk of morbidity and mortality. Consultation  with [place] is recommended.) consistent with at least submassive (intermediate risk) PE. The presence of right heart strain has been associated with an increased risk of morbidity and mortality. Small area of infiltrate inferior lingula. Bilateral pleural effusions with patchy areas of atelectatic change. Visualized thyroid diffusely enlarged.    Medical Consultants:  Pharmacy   Other Consultants:  None  IAnti-Infectives:   Rocephin 3/1 --> 3/7 Zithromax 3/3 --> 3/9  Faye Ramsay, MD  [facility] Pager [phone]  If 7PM-7AM, please contact night-coverage [email or web address] Password [email or web address] 03/12/2014, 1:22 PM   LOS: 5 days   HPI/Subjective: No events overnight. ABd discomfort in the epigastric area. Some nausea but no vomiting   Objective: Filed Vitals:   03/11/14 1442 03/11/14 2059 03/12/14 0519 03/12/14 1311  BP: 132/55 110/58 106/60 141/65  Pulse: 79 82 71 82  Temp: 98 F (36.7 C) 98.2 F (36.8 C) 98 F (36.7 C) 98 F (36.7 C)  TempSrc: Oral Oral Oral Oral  Resp: $Remo'20 20 17 20  'yZTVh$ Height:      Weight:      SpO2: 98% 97% 100% 100%    Intake/Output Summary (Last 24 hours) at 03/12/14 1322 Last data filed at 03/12/14 1245  Gross per 24 hour  Intake   1210 ml  Output    500 ml  Net    710 ml    Exam:   General:  Pt is alert, follows commands appropriately, not in acute distress  Cardiovascular: Regular rate and rhythm, S1/S2, no murmurs, no rubs, no gallops  Respiratory: Clear to auscultation bilaterally, no wheezing, no crackles, no rhonchi  Abdomen: Soft, tender in epigastric area, non distended, bowel sounds present, no guarding  Extremities: pulses DP and PT palpable bilaterally  Neuro: Grossly nonfocal  Data Reviewed: Basic Metabolic Panel:  Recent Labs Lab 03/07/14 2242  03/08/14 0630 03/08/14 1300 03/09/14 0441 03/09/14 1234 03/10/14 0520 03/12/14 0420  NA 132*  < > 136 135 133*  --  131* 135  K 5.8*  < > 4.5 3.9 4.7  --  4.3 4.0  CL 100  < > 108 107 105  --  102 101  CO2 17*  < > $R'21 22 21  'KQ$ --  20 26  GLUCOSE 512*  < > 329* 123* 286* 385* 237* 148*  BUN 31*  < > 32* 29* 28*  --  24* 15  CREATININE 1.40*  < > 1.45* 1.13* 1.10  --  0.96 0.83  CALCIUM 8.7  < > 8.4 8.1* 8.2*  --  8.2* 8.3*  MG 2.1  --   --   --   --   --   --   --   < > = values in this interval not  displayed.  CBC:  Recent Labs Lab 03/08/14 0630 03/09/14 0441 03/10/14 0520 03/11/14 0645 03/12/14 0420  WBC 7.1 11.8* 12.3* 9.7 10.6*  HGB 12.9 12.2 12.2 11.1* 11.3*  HCT 38.9 37.7 37.1 33.3* 34.5*  MCV 92.0 93.1 93.5 91.2 92.2  PLT 147* 158 139* 140* 138*   CBG:  Recent Labs Lab 03/11/14 1139 03/11/14 1654 03/11/14 2059 03/12/14 0735 03/12/14 1204  GLUCAP 254* 187* 240* 160* 191*    Recent Results (from the past 240 hour(s))  MRSA PCR Screening     Status: None   Collection Time: 03/07/14 10:50 PM  Result Value Ref Range Status   MRSA by PCR NEGATIVE NEGATIVE Final    Comment:  The GeneXpert MRSA Assay (FDA approved for NASAL specimens only), is one component of a comprehensive MRSA colonization surveillance program. It is not intended to diagnose MRSA infection nor to guide or monitor treatment for MRSA infections.   Urine culture     Status: None   Collection Time: 03/08/14  1:08 PM  Result Value Ref Range Status   Specimen Description URINE, CATHETERIZED  Final   Special Requests NONE  Final   Colony Count NO GROWTH Performed at [facility]   Final   Culture NO GROWTH Performed at [facility]   Final   Report Status 03/09/2014 FINAL  Final     Scheduled Meds: . atorvastatin  20 mg Oral Daily  . azithromycin  500 mg Intravenous Q24H  . cefTRIAXone (ROCEPHIN)  IV  1 g Intravenous Q24H  . diltiazem  180 mg Oral Daily  . enoxaparin (LOVENOX) injection  120 mg Subcutaneous Q12H  . insulin aspart  0-20 Units Subcutaneous TID WC  . insulin aspart  5 Units Subcutaneous TID WC  . insulin glargine  18 Units Subcutaneous Daily  . levothyroxine  112 mcg Oral QAC breakfast  . sodium chloride  10-40 mL Intracatheter Q12H  . sodium chloride  3 mL Intravenous Q12H  . Warfarin - Pharmacist Dosing Inpatient   Does not apply q1800   Continuous Infusions:

## 2014-03-13 LAB — GLUCOSE, CAPILLARY
GLUCOSE-CAPILLARY: 132 mg/dL — AB (ref 70–99)
GLUCOSE-CAPILLARY: 212 mg/dL — AB (ref 70–99)
Glucose-Capillary: 103 mg/dL — ABNORMAL HIGH (ref 70–99)
Glucose-Capillary: 219 mg/dL — ABNORMAL HIGH (ref 70–99)

## 2014-03-13 LAB — BASIC METABOLIC PANEL
ANION GAP: 4 — AB (ref 5–15)
BUN: 15 mg/dL (ref 6–23)
CO2: 26 mmol/L (ref 19–32)
Calcium: 8 mg/dL — ABNORMAL LOW (ref 8.4–10.5)
Chloride: 104 mmol/L (ref 96–112)
Creatinine, Ser: 0.83 mg/dL (ref 0.50–1.10)
GFR calc Af Amer: 82 mL/min — ABNORMAL LOW (ref >90)
GFR, EST NON AFRICAN AMERICAN: 70 mL/min — AB (ref >90)
Glucose, Bld: 115 mg/dL — ABNORMAL HIGH (ref 70–99)
Potassium: 3.8 mmol/L (ref 3.5–5.1)
SODIUM: 134 mmol/L — AB (ref 135–145)

## 2014-03-13 LAB — CBC
HCT: 30.9 % — ABNORMAL LOW (ref 36.0–46.0)
HEMOGLOBIN: 10.3 g/dL — AB (ref 12.0–15.0)
MCH: 30.5 pg (ref 26.0–34.0)
MCHC: 33.3 g/dL (ref 30.0–36.0)
MCV: 91.4 fL (ref 78.0–100.0)
Platelets: 136 10*3/uL — ABNORMAL LOW (ref 150–400)
RBC: 3.38 MIL/uL — ABNORMAL LOW (ref 3.87–5.11)
RDW: 14.1 % (ref 11.5–15.5)
WBC: 8.2 10*3/uL (ref 4.0–10.5)

## 2014-03-13 LAB — PROTIME-INR
INR: 1.9 — ABNORMAL HIGH (ref 0.00–1.49)
Prothrombin Time: 22 seconds — ABNORMAL HIGH (ref 11.6–15.2)

## 2014-03-13 LAB — LIPASE, BLOOD: LIPASE: 111 U/L — AB (ref 11–59)

## 2014-03-13 MED ORDER — DILTIAZEM HCL 25 MG/5ML IV SOLN
10.0000 mg | Freq: Once | INTRAVENOUS | Status: AC
  Administered 2014-03-13: 10 mg via INTRAVENOUS
  Filled 2014-03-13 (×2): qty 5

## 2014-03-13 MED ORDER — AZITHROMYCIN 250 MG PO TABS
250.0000 mg | ORAL_TABLET | Freq: Every day | ORAL | Status: DC
  Administered 2014-03-13 – 2014-03-15 (×3): 250 mg via ORAL
  Filled 2014-03-13 (×4): qty 1

## 2014-03-13 MED ORDER — WARFARIN SODIUM 7.5 MG PO TABS
7.5000 mg | ORAL_TABLET | Freq: Once | ORAL | Status: AC
  Administered 2014-03-13: 7.5 mg via ORAL
  Filled 2014-03-13: qty 1

## 2014-03-13 NOTE — Progress Notes (Signed)
Patient's heart rhythm has converted from NSR to atrial fib with RVR, HR at 110's, confirmed by EKG. Patient is resting in bed, asymptomatic,no complaints of chest pain, no shortness of breath noted. Daphane ShepherdM. Lynch NP was made aware, cardizem 10mg  IV bolus given as ordered. We will continue to monitor patient.

## 2014-03-13 NOTE — Progress Notes (Signed)
Patient ID: [name], female   DOB: 1945-10-13, 69 y.o.   MRN: [id number]  [facility] PROGRESS NOTE  AUSTIN PONGRATZ SJG:[id number] DOB: [birth year] DOA: 03/07/2014 PCP: Cari Caraway, MD  Brief narrative:    [name] is 69 yo female who presented to [facility] ED with main concern of right lower extremity swelling that started 10 days prior to admission. Patient has tried compression stockings for several days with minimum improvement in swelling. She has developed progressive dyspnea with exertion and at rest. She was admitted for further evaluation, noted to have pulmonary embolus and was started on heparin drip.  Hospital course prolonged due to PNA finding on CT angio chest 3/3 and new onset atrial fibrillation night of 3/5 requiring Cardizem IV. In addition, [name] with more abd pain 3/5 and CT abd with mild pancreatitis confirmed by elevated lipase level 111. Currently going back and forth between NSR and a-fib but clinically stable.   Assessment/Plan:    Principal Problem:  Acute respiratory failure with hypoxia on admission, oxygen saturation 88-90% on room air on admission - Secondary to acute pulmonary embolus and possibly underlying PNA noted on CT angio chest 3/3 - CT angio chest confirmed large saddle embolus (was done on 3/3 and initially could not be done due to acute renal failure) - it was assumed on admission that [name] had PE based on 2 D ECHO and US doppler positive for DVT  - cardiology has been consulted due to right heart strain but recommended no further interventions other than anticoagulation  - continue Lovenox with bridging to Coumadin, INR Is 1.9 this AM  - [name] reports feeling better and looks clinically stable this AM  Acute DVT, right lower extremity - AC as noted above   New onset of atrial fibrillation, night 3/5 - likely from pulmonary embolus itself  - [name] has received one dose of Cardizem IV overnight but is already on Cardizem at home 180 mg PO QD - will  continue same dose of Cardizem and if HR uncontrolled, will consider increase in the dose prior to discharge  - continue for now and keep on telemetry monitor   Pneumonia, CAP, lobar, inferior lingula, unknown pathogen  - noted on CT angio of the chest 3/3 - added Zithromax 3/3 to rocephin that was already started on 3/1 for UTI but should be also adequate for CAP coverage  - [name] remains afebrile over the past 48 hours and WNL overall trending down and WNL this AM Active Problems:   Abd pain 3/5  - CT abd 3/5 indicative of acute pancreatitis and lipase 111 - [name] actually feels better this AM, denies pain or N/V - tolerating diet well - will repeat lipase in AM   Diabetes mellitus, type II, 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 - A1c 9.1 - Patient on metformin and glimepiride at home but held initially due to acute renal failure  - Currently on Lantus 18 U - appreciate diabetic educator input  - [name] is aware she will go home on Lantus and will possibly be able to resume either Metformin or Glimepiride, would not keep on both oral antihyperglycemics to avoid hypoglycemic events   Acute on chronic kidney disease, 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 - Review of records indicate that in 2010 patient had GFR in 50's, her creatinine has however remained stable and within normal limits - [name] meets criteria for CKD stage 1-2 based on GFR and presence of risk factors including HTN, DM, HLD - Acute elevation in  creatinine this admission likely secondary to dehydration, prerenal etiology - IV fluids have been provided and creatinine remains within normal limits  - repeat BMP in AM   Anemia of chronic disease, DM and CKD stage I -II - drop in Hg since admission possibly due to anticoagulation  - no signs of active bleeding but Hg is down over the past 24 hours - will repeat CBC again in AM   Thrombocytopenia - Plt overall stable over the past 72 hours - CBC In AM  Hyponatremia - pre renal in etiology - Na overall stable    HLD (hyperlipidemia) - Continue statin  Essential hypertension - Reasonable inpatient control - continue Cardizem - lisinopril on hold but can likely be resumed upon discharge   Hypothyroidism - Continue Synthroid  Morbid obesity - Patient meets criteria for morbid obesity given comorbid conditions including hypertension, hyperlipidemia, diabetes and BMI greater than 36 - Body mass index is 38.28  Sepsis secondary to UTI, CAP - criteria met with T 101.1 F, RR 23, WBC 11 K on admission  - started on rocephin 3/1 - since CT chest on 3/3 notable for possible infiltrate as noted above, Zithromax was added to the regimen 3/3 - WBC is now WNL, [name] remains afebrile with RR at target range  - stop Rocephin after tomorrow's dose which will complete 7 days therapy  - will need to continue Zithromax until 3/9 but will convert to PO today   DVT prophylaxis - already on [facility] with Coumadin, INR 1.90  Code Status: Full.  Family Communication: plan of care discussed with the patient Disposition Plan: D/C when INR is therapeutic and if lipase is trending down. Also if HR get to > 115, may need increase in Cardizem dose prior to discharge. Placed order for SW and Case manager to try setting up [facility] RN to go to [name]'s home and check [name]/INR until [name] able to get to PCP office.   IV access:  Peripheral IV  Procedures and diagnostic studies:    Ct Angio Chest Pe W/cm &/or Wo Cm 03/10/2014 Positive for extensive acute PE with CT evidence of right heart strain (RV/LV Ratio = Positive for acute PE with CT evidence of right heart strain (RV/LV Ratio = 2.0) consistent with at least submassive (intermediate risk)PE. The presence of right heart strain has been associated with anincreased risk of morbidity and mortality. Consultation with [facility] [place] is recommended.) consistent with at least submassive (intermediate risk) PE. The presence of right heart strain has been associated  with an increased risk of morbidity and mortality. Small area of infiltrate inferior lingula. Bilateral pleural effusions with patchy areas of atelectatic change. Visualized thyroid diffusely enlarged.   Ct Abdomen Pelvis W Contrast  03/12/2014 Mild peripancreatic stranding which may represent mild pancreatitis. Correlate with labs.  Small amount of gas in the bladder -likely related to recent catheterization but infection not excluded.  Small amount of ascites within the abdomen and pelvis. Unchanged small right pleural effusion.  Cholelithiasis without CT evidence of acute cholecystitis.     Medical Consultants:  Pharmacy   Other Consultants:  None  IAnti-Infectives:   Rocephin 3/1 --> 3/7 Zithromax 3/3 --> 3/9  Faye Ramsay, MD  [facility] Pager [phone]  If 7PM-7AM, please contact night-coverage [email or web address] Password [facility] 03/13/2014, 12:51 PM   LOS: 6 days   HPI/Subjective: No events overnight. ABd discomfort and nausea resolved. No chest pain or shortness of breath.   Objective: Filed Vitals:   03/12/14  1311 03/12/14 2104 03/13/14 0553 03/13/14 0731  BP: 141/65 132/56 140/74 144/79  Pulse: 82 76 79 119  Temp: 98 F (36.7 C) 97.6 F (36.4 C) 97.9 F (36.6 C)   TempSrc: Oral Oral Oral   Resp: $Remo'20 18 18 18  'pEnMG$ Height:      Weight:      SpO2: 100% 94% 100% 100%    Intake/Output Summary (Last 24 hours) at 03/13/14 1251 Last data filed at 03/13/14 1144  Gross per 24 hour  Intake   1090 ml  Output   3850 ml  Net  -2760 ml    Exam:   General:  [name] is alert, follows commands appropriately, not in acute distress  Cardiovascular: Irregular rate and rhythm, no rubs, no gallops  Respiratory: Clear to auscultation bilaterally, rhonchi at bases and diminished breath sounds, no wheezing   Abdomen: Soft, tender in epigastric area, non distended, bowel sounds present, no guarding  Extremities: pulses DP and [name] palpable bilaterally  Neuro: Grossly nonfocal  Data  Reviewed: Basic Metabolic Panel:  Recent Labs Lab 03/07/14 2242  03/08/14 1300 03/09/14 0441 03/09/14 1234 03/10/14 0520 03/12/14 0420 03/13/14 0315  NA 132*  < > 135 133*  --  131* 135 134*  K 5.8*  < > 3.9 4.7  --  4.3 4.0 3.8  CL 100  < > 107 105  --  102 101 104  CO2 17*  < > 22 21  --  $R'20 26 26  'kc$ GLUCOSE 512*  < > 123* 286* 385* 237* 148* 115*  BUN 31*  < > 29* 28*  --  24* 15 15  CREATININE 1.40*  < > 1.13* 1.10  --  0.96 0.83 0.83  CALCIUM 8.7  < > 8.1* 8.2*  --  8.2* 8.3* 8.0*  MG 2.1  --   --   --   --   --   --   --   < > = values in this interval not displayed.  CBC:  Recent Labs Lab 03/09/14 0441 03/10/14 0520 03/11/14 0645 03/12/14 0420 03/13/14 0315  WBC 11.8* 12.3* 9.7 10.6* 8.2  HGB 12.2 12.2 11.1* 11.3* 10.3*  HCT 37.7 37.1 33.3* 34.5* 30.9*  MCV 93.1 93.5 91.2 92.2 91.4  PLT 158 139* 140* 138* 136*   CBG:  Recent Labs Lab 03/12/14 0735 03/12/14 1204 03/12/14 1726 03/12/14 2106 03/13/14 0718  GLUCAP 160* 191* 180* 172* 103*    Recent Results (from the past 240 hour(s))  MRSA PCR Screening     Status: None   Collection Time: 03/07/14 10:50 PM  Result Value Ref Range Status   MRSA by PCR NEGATIVE NEGATIVE Final    Comment:        The GeneXpert MRSA Assay (FDA approved for NASAL specimens only), is one component of a comprehensive MRSA colonization surveillance program. It is not intended to diagnose MRSA infection nor to guide or monitor treatment for MRSA infections.   Urine culture     Status: None   Collection Time: 03/08/14  1:08 PM  Result Value Ref Range Status   Specimen Description URINE, CATHETERIZED  Final   Special Requests NONE  Final   Colony Count NO GROWTH Performed at [facility]   Final   Culture NO GROWTH Performed at [facility]   Final   Report Status 03/09/2014 FINAL  Final     Scheduled Meds: . atorvastatin  20 mg Oral Daily  . azithromycin  500 mg  Intravenous Q24H  . cefTRIAXone  (ROCEPHIN)  IV  1 g Intravenous Q24H  . diltiazem  180 mg Oral Daily  . enoxaparin (LOVENOX) injection  120 mg Subcutaneous Q12H  . insulin aspart  0-20 Units Subcutaneous TID WC  . insulin aspart  5 Units Subcutaneous TID WC  . insulin glargine  18 Units Subcutaneous Daily  . levothyroxine  112 mcg Oral QAC breakfast  . sodium chloride  10-40 mL Intracatheter Q12H  . sodium chloride  3 mL Intravenous Q12H  . warfarin  7.5 mg Oral ONCE-1800  . Warfarin - Pharmacist Dosing Inpatient   Does not apply q1800   Continuous Infusions:

## 2014-03-13 NOTE — Progress Notes (Signed)
Assumed care of patient at 1645, patient had no needs at this time. Agree with previous RN's assessment. Will continue to monitor patient.

## 2014-03-13 NOTE — Progress Notes (Signed)
ANTICOAGULATION CONSULT NOTE - Follow Up  Pharmacy Consult for Lovenox, Warfarin Indication: pulmonary embolus and DVT  No Known Allergies  Patient Measurements: Height: 5\' 10"  (177.8 cm) Weight: 266 lb 12.1 oz (121 kg) IBW/kg (Calculated) : 68.5 Heparin Dosing Weight: 96 kg  Vital Signs: Temp: 97.9 F (36.6 C) (03/06 0553) Temp Source: Oral (03/06 0553) BP: 144/79 mmHg (03/06 0731) Pulse Rate: 119 (03/06 0731)  Labs:  Recent Labs  03/11/14 0645 03/12/14 0420 03/13/14 0315  HGB 11.1* 11.3* 10.3*  HCT 33.3* 34.5* 30.9*  PLT 140* 138* 136*  LABPROT 16.4* 16.3* 22.0*  INR 1.31 1.30 1.90*  CREATININE  --  0.83 0.83    Estimated Creatinine Clearance: 90.4 mL/min (by C-G formula based on Cr of 0.83).   Medications:  Infusions:     Assessment: 69 yo female who presents with increasing shortness of breath and RLE swelling over the past 7 days. She has history of PE several years ago for which she was previously on Coumadin for 6 months. D-dimer >20, currently unable to receive IV contrast due to renal function. Pharmacy was initially consulted to dose IV heparin for PE.   Significant events 3/1: BL duplex shows new acute R DVT, and small chronic L DVT (prob from 2010). 2-D echo consistent with PE. 3/2: HL therapeutic. 3/3: CTA revealed large saddle PE. Heparin switched to Lovenox and started Coumadin w/ 7.5mg . Baseline INR 1.3. 3/4: INR 1.31. Coumadin 7.5mg  3/5: INR 1.3. Coumadin 12.5mg   Today, 03/13/2014:  INR close to therapeutic at 1.9, increased significantly last 24hrs.   CBC: Hgb stable. Pltc slightly low, stable. (184k on 2/29).  New-onset of A.fib with RVR this am. Given a Diltiazem bolus. Cont on home Diltiazem.  No bleeding reported/documented.  SCr stable, CrCl 90  Diet: carb modified - tolerating  Drug-drug interactions: ceftriaxone and azithromycin (broad spectrum abx may increase INR)   Goal of Therapy:  INR 2-3 Heparin level 0.3-0.7  units/ml Monitor platelets by anticoagulation protocol: Yes   Plan:   Cont Lovenox 120mg  SQ q12h.   Give Coumadin 7.5mg  PO today, at 1800.  Overlap day 4 of 5 as recommended by CHEST guidelines.  Daily INR, and CBC.  Continue to monitor H&H and platelets.  Charolotte Ekeom Wylan Gentzler, PharmD, pager [phone]. 03/13/2014,10:49 AM.

## 2014-03-13 NOTE — Progress Notes (Signed)
INITIAL NUTRITION ASSESSMENT  DOCUMENTATION CODES Per approved criteria  -Obesity Unspecified   INTERVENTION: Encouraged PO intake Provided brief diet education  NUTRITION DIAGNOSIS: Overweight/obesity related to excessive energy intake/lack of physical activity as evidenced by BMI of 38.3.   Goal: Pt to meet >/= 90% of their estimated nutrition needs   Monitor:  PO intake, weight, labs, I/O's  Reason for Assessment: Consult for nutritional assessment  Admitting Dx: Acute pulmonary embolism  ASSESSMENT: 69 yo female who presented to [facility] ED with main concern of right lower extremity swelling that started 10 days prior to admission.   Pt reports eating less than usual for a couple of days PTA. Pt states she was eating half of what she normally eats d/t getting full quickly. Pt states her weight usually fluctuates between 220-280 lb since starting the [employer] program (a weight loss group that provides education, exercise opportunities and they also educate on the exchange list).  Pt with uncontrolled blood sugars PTA. Pt states she has had DM since 2003 and "knows what I need to eat". Pt reports eating out a lot. She states she has a hard time following what she needs to do. Pt states she is considering bariatric surgery.  RD answered all diet related questions for patient which ranged from healthy fats to healthy snacks.  Nutrition focused physical exam shows no sign of depletion of muscle mass or body fat.  Labs reviewed: Low Na  Height: Ht Readings from Last 1 Encounters:  03/07/14  (1.778 m)    Weight: Wt Readings from Last 1 Encounters:  03/09/14 266 lb 12.1 oz (121 kg)    Ideal Body Weight: 150 lb  % Ideal Body Weight: 177%  Wt Readings from Last 10 Encounters:  03/09/14 266 lb 12.1 oz (121 kg)    Usual Body Weight: 220-280 lb -per pt  % Usual Body Weight: 100%  BMI:  Body mass index is 38.28 kg/(m^2).  Estimated Nutritional Needs: Kcal:  1900-2100 Protein: 80-90g Fluid: 1.9L/day  Skin: intact  Diet Order: Diet Carb Modified  EDUCATION NEEDS: -Education needs addressed   Intake/Output Summary (Last 24 hours) at 03/13/14 1003 Last data filed at 03/13/14 0800  Gross per 24 hour  Intake   1330 ml  Output   3550 ml  Net  -2220 ml    Last BM: 3/5  Labs:   Recent Labs Lab 03/07/14 2242  03/10/14 0520 03/12/14 0420 03/13/14 0315  NA 132*  < > 131* 135 134*  K 5.8*  < > 4.3 4.0 3.8  CL 100  < > 102 101 104  CO2 17*  < > BUN 31*  < > 24* 15 15  CREATININE 1.40*  < > 0.96 0.83 0.83  CALCIUM 8.7  < > 8.2* 8.3* 8.0*  MG 2.1  --   --   --   --   GLUCOSE 512*  < > 237* 148* 115*  < > = values in this interval not displayed.  CBG (last 3)   Recent Labs  03/12/14 1726 03/12/14 2106 03/13/14 0718  GLUCAP 180* 172* 103*    Scheduled Meds: . atorvastatin  20 mg Oral Daily  . azithromycin  500 mg Intravenous Q24H  . cefTRIAXone (ROCEPHIN)  IV  1 g Intravenous Q24H  . diltiazem  180 mg Oral Daily  . enoxaparin (LOVENOX) injection  120 mg Subcutaneous Q12H  . insulin aspart  0-20 Units Subcutaneous TID WC  . insulin aspart  5 Units Subcutaneous TID WC  . insulin glargine  18 Units Subcutaneous Daily  . levothyroxine  112 mcg Oral QAC breakfast  . sodium chloride  10-40 mL Intracatheter Q12H  . sodium chloride  3 mL Intravenous Q12H  . Warfarin - Pharmacist Dosing Inpatient   Does not apply q1800    Continuous Infusions:   Past Medical History  Diagnosis Date  . Pregnancy   . PE (pulmonary embolism) 8 2010  . Hypothyroid   . HLD (hyperlipidemia)   . Hypertension   . Diabetes     History reviewed. No pertinent past surgical history.  Tilda FrancoLindsey Rayelle Armor, MS, RD, LDN Pager: [phone] After Hours Pager: [phone]

## 2014-03-14 DIAGNOSIS — I82409 Acute embolism and thrombosis of unspecified deep veins of unspecified lower extremity: Secondary | ICD-10-CM | POA: Diagnosis present

## 2014-03-14 DIAGNOSIS — E039 Hypothyroidism, unspecified: Secondary | ICD-10-CM

## 2014-03-14 DIAGNOSIS — J189 Pneumonia, unspecified organism: Secondary | ICD-10-CM | POA: Clinically undetermined

## 2014-03-14 LAB — PROTIME-INR
INR: 1.9 — ABNORMAL HIGH (ref 0.00–1.49)
Prothrombin Time: 22 seconds — ABNORMAL HIGH (ref 11.6–15.2)

## 2014-03-14 LAB — BASIC METABOLIC PANEL
Anion gap: 6 (ref 5–15)
BUN: 13 mg/dL (ref 6–23)
CO2: 24 mmol/L (ref 19–32)
CREATININE: 0.83 mg/dL (ref 0.50–1.10)
Calcium: 7.8 mg/dL — ABNORMAL LOW (ref 8.4–10.5)
Chloride: 101 mmol/L (ref 96–112)
GFR, EST AFRICAN AMERICAN: 82 mL/min — AB (ref >90)
GFR, EST NON AFRICAN AMERICAN: 70 mL/min — AB (ref >90)
Glucose, Bld: 147 mg/dL — ABNORMAL HIGH (ref 70–99)
POTASSIUM: 3.6 mmol/L (ref 3.5–5.1)
Sodium: 131 mmol/L — ABNORMAL LOW (ref 135–145)

## 2014-03-14 LAB — CBC
HEMATOCRIT: 30.9 % — AB (ref 36.0–46.0)
Hemoglobin: 10.3 g/dL — ABNORMAL LOW (ref 12.0–15.0)
MCH: 30.7 pg (ref 26.0–34.0)
MCHC: 33.3 g/dL (ref 30.0–36.0)
MCV: 92.2 fL (ref 78.0–100.0)
PLATELETS: 155 10*3/uL (ref 150–400)
RBC: 3.35 MIL/uL — ABNORMAL LOW (ref 3.87–5.11)
RDW: 14.3 % (ref 11.5–15.5)
WBC: 7 10*3/uL (ref 4.0–10.5)

## 2014-03-14 LAB — GLUCOSE, CAPILLARY
Glucose-Capillary: 137 mg/dL — ABNORMAL HIGH (ref 70–99)
Glucose-Capillary: 167 mg/dL — ABNORMAL HIGH (ref 70–99)
Glucose-Capillary: 109 mg/dL — ABNORMAL HIGH (ref 70–99)
Glucose-Capillary: 80 mg/dL (ref 70–99)

## 2014-03-14 LAB — LIPASE, BLOOD: Lipase: 92 U/L — ABNORMAL HIGH (ref 11–59)

## 2014-03-14 MED ORDER — WARFARIN SODIUM 10 MG PO TABS
10.0000 mg | ORAL_TABLET | Freq: Once | ORAL | Status: AC
  Administered 2014-03-14: 10 mg via ORAL
  Filled 2014-03-14: qty 1

## 2014-03-14 MED ORDER — ENOXAPARIN (LOVENOX) PATIENT EDUCATION KIT
PACK | Freq: Once | Status: AC
  Administered 2014-03-14: 11:00:00
  Filled 2014-03-14: qty 1

## 2014-03-14 NOTE — Evaluation (Signed)
Physical Therapy Evaluation Patient Details Name: [name] MRN: [id number] DOB: February 29, 1945 Today's Date: 03/14/2014   History of Present Illness  69 yo female admitted with acute PE, R LE DVT. hx of DM, HTN  Clinical Impression  On eval, pt was Min guard assist for mobility-able to ambulate ~200 feet while holding onto IV pole. Dyspnea 2/4. Pt tolerated activity well. Recommend daily ambulation in hallway with nursing supervision.     Follow Up Recommendations Home health PT (depends on progress-may not need)    Equipment Recommendations  None recommended by PT    Recommendations for Other Services       Precautions / Restrictions Precautions Precautions: Fall Restrictions Weight Bearing Restrictions: No      Mobility  Bed Mobility               General bed mobility comments: pt sitting EOB  Transfers Overall transfer level: Needs assistance   Transfers: Sit to/from Stand Sit to Stand: Min guard         General transfer comment: close guard for safety  Ambulation/Gait Ambulation/Gait assistance: Min guard Ambulation Distance (Feet): 200 Feet (150' with IV pole; 50 feet without support) Assistive device:  (IV pole) Gait Pattern/deviations: Step-through pattern     General Gait Details: slightly unsteady. dyspnea 2/4 with ambulation. 1 brief standing rest break at window at end of hall.   Stairs            Wheelchair Mobility    Modified Rankin (Stroke Patients Only)       Balance                                             Pertinent Vitals/Pain Pain Assessment: No/denies pain    Home Living Family/patient expects to be discharged to:: Private residence Living Arrangements: Spouse/significant other   Type of Home: House Home Access: Stairs to enter Entrance Stairs-Rails: Right Entrance Stairs-Number of Steps: 2+1 Home Layout: One level Home Equipment: None Additional Comments: RW, rollator, crutches  (husband has from previous admissions)    Prior Function Level of Independence: Independent               Hand Dominance        Extremity/Trunk Assessment   Upper Extremity Assessment: Overall WFL for tasks assessed           Lower Extremity Assessment: Generalized weakness      Cervical / Trunk Assessment: Normal  Communication   Communication: No difficulties  Cognition Arousal/Alertness: Awake/alert Behavior During Therapy: WFL for tasks assessed/performed Overall Cognitive Status: Within Functional Limits for tasks assessed                      General Comments      Exercises        Assessment/Plan    PT Assessment Patient needs continued PT services  PT Diagnosis Difficulty walking;Generalized weakness   PT Problem List Decreased activity tolerance;Decreased balance;Decreased mobility  PT Treatment Interventions Gait training;Functional mobility training;Therapeutic activities;Therapeutic exercise;Patient/family education;Balance training   PT Goals (Current goals can be found in the Care Plan section) Acute Rehab PT Goals Patient Stated Goal: home soon. regain independence PT Goal Formulation: With patient Time For Goal Achievement: 03/28/14 Potential to Achieve Goals: Good    Frequency Min 3X/week   Barriers to discharge  Co-evaluation               End of Session Equipment Utilized During Treatment: Gait belt Activity Tolerance: Patient tolerated treatment well Patient left: in chair;with call bell/phone within reach;with chair alarm set           Time: [id number] PT Time Calculation (min) (ACUTE ONLY): 17 min   Charges:   PT Evaluation $Initial PT Evaluation Tier I: 1 Procedure     PT G Codes:        Rebeca AlertJannie Antoinetta Berrones, MPT Pager: [phone]

## 2014-03-14 NOTE — Progress Notes (Signed)
ANTICOAGULATION CONSULT NOTE - Follow Up  Pharmacy Consult for Lovenox, Warfarin Indication: pulmonary embolus and DVT  No Known Allergies  Patient Measurements: Height: 5\' 10"  (177.8 cm) Weight: 266 lb 12.1 oz (121 kg) IBW/kg (Calculated) : 68.5 Heparin Dosing Weight: 96 kg  Vital Signs: Temp: 97.6 F (36.4 C) (03/07 0449) Temp Source: Oral (03/07 0449) BP: 133/71 mmHg (03/07 0449) Pulse Rate: 88 (03/07 0449)  Labs:  Recent Labs  03/12/14 0420 03/13/14 0315 03/14/14 0700  HGB 11.3* 10.3* 10.3*  HCT 34.5* 30.9* 30.9*  PLT 138* 136* 155  LABPROT 16.3* 22.0* 22.0*  INR 1.30 1.90* 1.90*  CREATININE 0.83 0.83 0.83    Estimated Creatinine Clearance: 90.4 mL/min (by C-G formula based on Cr of 0.83).   Medications:  Infusions:     Assessment: 69 yo female who presents with increasing shortness of breath and RLE swelling over the past 7 days. She has history of PE several years ago for which she was previously on Coumadin for 6 months. D-dimer >20, currently unable to receive IV contrast due to renal function. Pharmacy was initially consulted to dose IV heparin for PE.   Significant events 3/1: BL duplex shows new acute R DVT, and small chronic L DVT (prob from 2010). 2-D echo consistent with PE. 3/2: HL therapeutic. 3/3: CTA revealed large saddle PE. Heparin switched to Lovenox and started Coumadin w/ 7.5mg . Baseline INR 1.3.   Today, 03/14/2014:  INR = 1.9 (SUBtherapeutic and INR unchanged/24h). Warfarin Dose reduce 3/6pm for INR rate of rise 3/6am  CBC: Hgb stable. Pltc improved from 136 3/6. (184k on 2/29).  New-onset of A.fib with RVR  3/6am - back into NSR (on diltiazem)  No bleeding reported/documented.  SCr stable, CrCl 90  Diet: carb modified - tolerating  Drug-drug interactions: ceftriaxone and azithromycin (broad spectrum abx may increase INR)   Goal of Therapy:  INR 2-3 LMWH anti-Xa level 0.6-1 Monitor platelets by anticoagulation protocol:  Yes   Plan:  VTE Core measure: Overlap day 5 of 5 but INR < 2 - need to continue LMWH  Continue Lovenox 120mg  SQ q12h.   Give Coumadin 10 mg PO today at 1800.  Daily INR  CBC q72h while on LMWH in hospital.  Continue to monitor H&H and platelets.  If discharged, suggest warfarin 5mg  tabs - take tabs every evening until INR follow-up (INR follow-up Wed at latest).  Continue enoxaparin 120mg  SQ q12h (suggest 72h supply or 6 syringes)  Juliette Alcideustin Zeigler, PharmD, BCPS.   Pager: [phone]  03/14/2014,9:12 AM.

## 2014-03-14 NOTE — Progress Notes (Signed)
[facility] PROGRESS NOTE  [name]:[id number] DOB: Jul 11, 1945 DOA: 03/07/2014 PCP: Cari Caraway, MD  Brief narrative:    Pt is 69 yo female who presented to [facility] ED with main concern of right lower extremity swelling that started 10 days prior to admission. Patient has tried compression stockings for several days with minimum improvement in swelling. She has developed progressive dyspnea with exertion and at rest. She was admitted for further evaluation, noted to have pulmonary embolus and was started on heparin drip.  Hospital course prolonged due to PNA finding on CT angio chest 3/3 and new onset atrial fibrillation night of 3/5 requiring Cardizem IV. In addition, pt with more abd pain 3/5 and CT abd with mild pancreatitis confirmed by elevated lipase level 111. Currently going back and forth between NSR and a-fib but clinically stable.           Assessment/Plan: #1 acute respiratory failure with hypoxia on admission, oxygen sats of 88-90% on roomair admission Multifactorial secondary to acute pulmonary embolus and possible underlying pneumonia noted on CT angiogram of 03/10/2014. CT angiogram with large saddle embolus. It was assumed on admission the patient had PE based on 2-D echo and Doppler ultrasound which was positive for bilateral DVTs. Cardiology was consulted due to right heart strain and recommended no further interventions other than anticoagulation. INR currently at 1.90. Continue Lovenox bridge with Coumadin. This is patient's second event and will likely need to be on anticoagulation for the rest of her life. Follow.  #2 acute right lower extremity DVT/questionable chronic left lower extremity DVT Continue Lovenox bridge with Coumadin. INR currently at 1.0. Goal INR 2-3. Follow.  #3 new-onset atrial fibrillation night of 3/5 Likely secondary to acute PE. Patient received a dose of IV Cardizem overnight but already on home regimen of Cardizem 180 mg daily.  Heart rate controlled for now. On anticoagulation.  #4 probable pneumonia/, he acquired pneumonia/lobar/inferior lingula Noted on CT chest 3/3 2016. Patient currently on azithromycin. Patient has received IV Rocephin. Follow.  #5 acute pancreatitis Likely secondary to gallstone pancreatitis. Lipase levels were elevated at 111 however trending down. Clinical improvement. Lipase levels trending down. Patient tolerating current diet. Will likely need to follow-up with general surgery as outpatient for elective cholecystectomy. Follow.  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 Oral hypoglycemics on hold. Continue current dose of Lantus. Sliding scale insulin.  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 Improved with hydration. Follow.  #8 anemia of chronic disease H&H stable. Follow.  #9 hyperlipidemia Continue statin.  #10 hyponatremia Improved.  #11 hypothyroidism Continue home dose Synthroid.  #12 thrombocytopenia Resolved. Follow.  #13 morbid obesity  #14 sepsis secondary to UTI/commonly acquired pneumonia Criteria was met with a temperature 101.1 Fahrenheit, respiratory rate of 23, white count of 11,000 on admission. Patient was initially started on IV Rocephin. CT of the chest on 3/3 notable for possible infiltrate and a such azithromycin was added to the regimen on 3/3. WBC chanting down. Patient currently afebrile. Patient status post 7 days of IV Rocephin. Will continue azithromycin until 3/9.  #15 prophylaxis On Lovenox bridge with Coumadin for DVT prophylaxis. Current INR 1.9.  Code Status: Full Family Communication: Updated patient and family present. Disposition Plan: Home when medically stable in 1-2 days, once INR is therapeutic.   Consultants:  None  Procedures:  CT Angela chest 03/10/2014  CT abdomen and pelvis  03/12/2014  Lower extremity Dopplers 03/08/2014  2-D echo 03/08/2014  Chest x-ray 03/07/2014  Antibiotics: Rocephin 3/1 --> 3/7 Zithromax 3/3 -->   HPI/Subjective: Patient states she's feeling better. No complaints. Patient states shortness of breath has improved.  Objective: Filed Vitals:   03/14/14 1352  BP: 122/61  Pulse: 82  Temp: 98.1 F (36.7 C)  Resp: 18    Intake/Output Summary (Last 24 hours) at 03/14/14 1515 Last data filed at 03/14/14 1407  Gross per 24 hour  Intake    480 ml  Output   2001 ml  Net  -1521 ml   Filed Weights   03/07/14 2058 03/08/14 0400 03/09/14 0400  Weight: 113.399 kg (250 lb) 116.4 kg (256 lb 9.9 oz) 121 kg (266 lb 12.1 oz)    Exam:   General:  NAD  Cardiovascular: RRR  Respiratory: CTAB  Abdomen: Soft, NT,ND,+BS  Musculoskeletal: No c/c/. 2-3 + BLE edema  Data Reviewed: Basic Metabolic Panel:  Recent Labs Lab 03/07/14 2242  03/09/14 0441 03/09/14 1234 03/10/14 0520 03/12/14 0420 03/13/14 0315 03/14/14 0700  NA 132*  < > 133*  --  131* 135 134* 131*  K 5.8*  < > 4.7  --  4.3 4.0 3.8 3.6  CL 100  < > 105  --  102 101 104 101  CO2 17*  < > 21  --  _0 GLUCOSE 512*  < > 286* 385* 237* 148* 115* 147*  BUN 31*  < > 28*  --  24* _1 CREATININE 1.40*  < > 1.10  --  0.96 0.83 0.83 0.83  CALCIUM 8.7  < > 8.2*  --  8.2* 8.3* 8.0* 7.8*  MG 2.1  --   --   --   --   --   --   --   < > = values in this interval not displayed. Liver Function Tests: No results for input(s): AST, ALT, ALKPHOS, BILITOT, PROT, ALBUMIN in the last 168 hours.  Recent Labs Lab 03/13/14 0315 03/14/14 0700  LIPASE 111* 92*   No results for input(s): AMMONIA in the last 168 hours. CBC:  Recent Labs Lab 03/10/14 0520 03/11/14 0645 03/12/14 0420 03/13/14 0315 03/14/14 0700  WBC 12.3* 9.7 10.6* 8.2 7.0  HGB 12.2 11.1* 11.3* 10.3* 10.3*  HCT 37.1 33.3* 34.5* 30.9* 30.9*  MCV 93.5 91.2 92.2 91.4 92.2  PLT 139* 140* 138*  136* 155   Cardiac Enzymes: No results for input(s): CKTOTAL, CKMB, CKMBINDEX, TROPONINI in the last 168 hours. BNP (last 3 results)  Recent Labs  03/07/14 2241  BNP 848.9*    ProBNP (last 3 results) No results for input(s): PROBNP in the last 8760 hours.  CBG:  Recent Labs Lab 03/13/14 1158 03/13/14 1624 03/13/14 2249 03/14/14 0714 03/14/14 1147  GLUCAP 219* 132* 212* 137* 167*    Recent Results (from the past 240 hour(s))  MRSA PCR Screening     Status: None   Collection Time: 03/07/14 10:50 PM  Result Value Ref Range Status   MRSA by PCR NEGATIVE NEGATIVE Final    Comment:        The GeneXpert MRSA Assay (FDA approved for NASAL specimens only), is one component of a comprehensive MRSA colonization surveillance program. It is not intended to diagnose MRSA infection nor to guide or monitor treatment for MRSA infections.   Urine culture     Status: None   Collection Time:  03/08/14  1:08 PM  Result Value Ref Range Status   Specimen Description URINE, CATHETERIZED  Final   Special Requests NONE  Final   Colony Count NO GROWTH Performed at [facility]   Final   Culture NO GROWTH Performed at [facility]   Final   Report Status 03/09/2014 FINAL  Final     Studies: Ct Abdomen Pelvis W Contrast  03/12/2014   CLINICAL DATA:  69 year old female abdominal and pelvic pain. Patient diagnosed with DVT, pulmonary emboli and pneumonia.  EXAM: CT ABDOMEN AND PELVIS WITH CONTRAST  TECHNIQUE: Multidetector CT imaging of the abdomen and pelvis was performed using the standard protocol following bolus administration of intravenous contrast.  CONTRAST:  113m OMNIPAQUE IOHEXOL 300 MG/ML  SOLN  COMPARISON:  08/09/2008 abdominal/pelvic CT.  03/10/2014 chest CT  FINDINGS: Lower chest:  A small right pleural effusion is again noted.  Hepatobiliary: The liver is unremarkable. Cholelithiasis identified without CT evidence of acute cholecystitis.  Pancreas: Mild  peripancreatic stranding is suspicious for mild pancreatitis. No acute peripancreatic fluid collections, pancreatic necrosis, hemorrhage or venous thrombosis noted.  Spleen: Unremarkable  Adrenals/Urinary Tract: Mild bilateral renal cortical atrophy identified. There is no evidence of hydronephrosis or renal mass. The adrenal glands are unremarkable. Small amount of gas in the bladder may be related to recent catheterization versus infection.  Stomach/Bowel: Colonic diverticulosis noted without diverticulitis. There is no evidence of bowel obstruction or pneumoperitoneum. The appendix is normal.  Vascular/Lymphatic: There is no evidence of abdominal aortic aneurysm or enlarged lymph nodes. The patient's known in DVT is difficult to visualize on this study.  Reproductive: Unremarkable  Other: A small amount of ascites within the abdomen and pelvis noted.  Musculoskeletal: No acute or suspicious bony abnormalities are identified.  IMPRESSION: Mild peripancreatic stranding which may represent mild pancreatitis. Correlate with labs.  Small amount of gas in the bladder -likely related to recent catheterization but infection not excluded.  Small amount of ascites within the abdomen and pelvis. Unchanged small right pleural effusion.  Cholelithiasis without CT evidence of acute cholecystitis.   Electronically Signed   By: JMargarette CanadaM.D.   On: 03/12/2014 19:06    Scheduled Meds: . atorvastatin  20 mg Oral Daily  . azithromycin  250 mg Oral Daily  . diltiazem  180 mg Oral Daily  . enoxaparin (LOVENOX) injection  120 mg Subcutaneous Q12H  . insulin aspart  0-20 Units Subcutaneous TID WC  . insulin aspart  5 Units Subcutaneous TID WC  . insulin glargine  18 Units Subcutaneous Daily  . levothyroxine  112 mcg Oral QAC breakfast  . sodium chloride  10-40 mL Intracatheter Q12H  . sodium chloride  3 mL Intravenous Q12H  . warfarin  10 mg Oral ONCE-1800  . Warfarin - Pharmacist Dosing Inpatient   Does not apply  q1800   Continuous Infusions:   Principal Problem:   Acute pulmonary embolism Active Problems:   Dyspnea   SOB (shortness of breath)   Metabolic acidosis   Hyperglycemia   Diabetes mellitus out of control   AKI (acute kidney injury)   Hyperkalemia   Hyponatremia   HLD (hyperlipidemia)   Essential hypertension   Hypothyroidism   Dehydration    Time spent: 346ms    THGolds[facility] End[facility] CenterD [facility] Pager [phone] 7PM-7AM, please contact night-coverage at [email or web address], password [email or web address]/07/2014, 3:15 PM  LOS: 7 days

## 2014-03-15 DIAGNOSIS — K859 Acute pancreatitis without necrosis or infection, unspecified: Secondary | ICD-10-CM | POA: Insufficient documentation

## 2014-03-15 LAB — CBC
HEMATOCRIT: 31.5 % — AB (ref 36.0–46.0)
Hemoglobin: 10.2 g/dL — ABNORMAL LOW (ref 12.0–15.0)
MCH: 30.1 pg (ref 26.0–34.0)
MCHC: 32.4 g/dL (ref 30.0–36.0)
MCV: 92.9 fL (ref 78.0–100.0)
Platelets: 175 10*3/uL (ref 150–400)
RBC: 3.39 MIL/uL — ABNORMAL LOW (ref 3.87–5.11)
RDW: 14.5 % (ref 11.5–15.5)
WBC: 7 10*3/uL (ref 4.0–10.5)

## 2014-03-15 LAB — BASIC METABOLIC PANEL
Anion gap: 8 (ref 5–15)
BUN: 10 mg/dL (ref 6–23)
CALCIUM: 8.1 mg/dL — AB (ref 8.4–10.5)
CHLORIDE: 105 mmol/L (ref 96–112)
CO2: 25 mmol/L (ref 19–32)
Creatinine, Ser: 0.75 mg/dL (ref 0.50–1.10)
GFR calc non Af Amer: 84 mL/min — ABNORMAL LOW (ref >90)
Glucose, Bld: 90 mg/dL (ref 70–99)
Potassium: 3.7 mmol/L (ref 3.5–5.1)
Sodium: 138 mmol/L (ref 135–145)

## 2014-03-15 LAB — GLUCOSE, CAPILLARY
GLUCOSE-CAPILLARY: 86 mg/dL (ref 70–99)
Glucose-Capillary: 153 mg/dL — ABNORMAL HIGH (ref 70–99)

## 2014-03-15 LAB — PROTIME-INR
INR: 2.2 — ABNORMAL HIGH (ref 0.00–1.49)
Prothrombin Time: 24.7 seconds — ABNORMAL HIGH (ref 11.6–15.2)

## 2014-03-15 MED ORDER — WARFARIN SODIUM 7.5 MG PO TABS
7.5000 mg | ORAL_TABLET | Freq: Once | ORAL | Status: DC
  Filled 2014-03-15: qty 1

## 2014-03-15 MED ORDER — WARFARIN SODIUM 5 MG PO TABS: 7.5000 mg | ORAL_TABLET | Freq: Every day | ORAL | Status: DC

## 2014-03-15 MED ORDER — ENOXAPARIN SODIUM 150 MG/ML ~~LOC~~ SOLN
180.0000 mg | Freq: Once | SUBCUTANEOUS | Status: AC
  Administered 2014-03-15: 180 mg via SUBCUTANEOUS
  Filled 2014-03-15: qty 2

## 2014-03-15 MED ORDER — OXYCODONE HCL 5 MG PO TABS: 5.0000 mg | ORAL_TABLET | ORAL | Status: DC | PRN

## 2014-03-15 NOTE — Progress Notes (Signed)
Patient discharged home with husband, discharge instructions given and explained to patient, patient verbalized understanding, denies any pain/distress, accompanied home by husband. No wound noted, skin intact.

## 2014-03-15 NOTE — Progress Notes (Signed)
ANTICOAGULATION CONSULT NOTE - Follow Up  Pharmacy Consult for Lovenox, Warfarin Indication: pulmonary embolus and DVT  No Known Allergies  Patient Measurements: Height: 5\' 10"  (177.8 cm) Weight: 266 lb 12.1 oz (121 kg) IBW/kg (Calculated) : 68.5 Heparin Dosing Weight: 96 kg  Vital Signs: Temp: 98.2 F (36.8 C) (03/08 0639) Temp Source: Oral (03/08 0639) BP: 114/70 mmHg (03/08 0639) Pulse Rate: 80 (03/08 0639)  Labs:  Recent Labs  03/13/14 0315 03/14/14 0700 03/15/14 0545  HGB 10.3* 10.3* 10.2*  HCT 30.9* 30.9* 31.5*  PLT 136* 155 175  LABPROT 22.0* 22.0* 24.7*  INR 1.90* 1.90* 2.20*  CREATININE 0.83 0.83 0.75    Estimated Creatinine Clearance: 93.8 mL/min (by C-G formula based on Cr of 0.75).   Medications:  Infusions:     Assessment: 69 yo female who presents with increasing shortness of breath and RLE swelling over the past 7 days. She has history of PE several years ago for which she was previously on Coumadin for 6 months. D-dimer >20, currently unable to receive IV contrast due to renal function. Pharmacy was initially consulted to dose IV heparin for PE.   Significant events 3/1: BL duplex shows new acute R DVT, and small chronic L DVT (prob from 2010). 2-D echo consistent with PE. 3/2: HL therapeutic. 3/3: CTA revealed large saddle PE. Heparin switched to Lovenox and started Coumadin w/ 7.5mg . Baseline INR 1.3.   Today, 03/15/2014:  INR = 2.2 (therapeutic)  CBC: Hgb stable. Pltc improved from 136 3/6. (184k on 2/29).  New-onset of A.fib with RVR  3/6am (on diltiazem)  No bleeding reported/documented.  SCr stable, CrCl 90  Diet: carb modified - tolerating  Drug-drug interactions: ceftriaxone and azithromycin (broad spectrum abx may increase INR)  Goal of Therapy:  INR 2-3 LMWH anti-Xa level 0.6-1 Monitor platelets by anticoagulation protocol: Yes   Plan:  VTE Core measure: Overlap day 6 - need to complete 24h overlap now that INR is  therapeutic.   Give Lovenox 180mg  SQ x 1 today at noon to complete overlap therapy with INR > 2 for 24h   Give Coumadin 7.5mg  PO today at 1800.  Daily INR  CBC q72h while on LMWH in hospital.  Continue to monitor H&H and platelets.  If discharged, suggest warfarin 5mg  tabs - take 1.5 tabs every evening until INR follow-up (INR follow-up Thur/Fri).    Juliette Alcideustin Zeigler, PharmD, BCPS.   Pager: [phone]  03/15/2014,8:44 AM.

## 2014-03-15 NOTE — Discharge Summary (Signed)
Physician Discharge Summary  [name] KVQ:[id number] DOB: [birth year] DOA: 03/07/2014  PCP: Cari Caraway, MD  Admit date: 03/07/2014 Discharge date: 03/15/2014  Time spent: 70 minutes  Recommendations for Outpatient Follow-up:  1. Patient will have a home health RN check PT/INR on Thursday, 03/17/2014 and results will be sent to patient's PCP Dr. Cari Caraway for further management of her saddle embolus and DVT. INR on day of discharge was 2.20. Patient is being discharged on Coumadin 7.5 mg daily. 2. Follow-up with MCNEILL,WENDY, MD as outpatient in one week. On follow-up patient needs a basic metabolic profile done to follow-up on electrolytes and renal function. Patient also need a CBC done to follow-up on her hemoglobin. Patient was noted to have an acute pancreatitis episode during the hospitalization with abdominal ultrasound consistent with cholelithiasis. Patient will likely benefit from a referral to general surgery as outpatient for further evaluation and workup for cholecystectomy.  Discharge Diagnoses:  Principal Problem:   Acute pulmonary embolism Active Problems:   DVT (deep venous thrombosis)   CAP (community acquired pneumonia)   Dyspnea   SOB (shortness of breath)   Metabolic acidosis   Hyperglycemia   Diabetes mellitus out of control   AKI (acute kidney injury)   Hyperkalemia   Hyponatremia   HLD (hyperlipidemia)   Essential hypertension   Hypothyroidism   Dehydration   Discharge Condition: Stable and improved  Diet recommendation: Heart healthy  Filed Weights   03/07/14 2058 03/08/14 0400 03/09/14 0400  Weight: 113.399 kg (250 lb) 116.4 kg (256 lb 9.9 oz) 121 kg (266 lb 12.1 oz)    History of present illness:  Per Dr Linna Darner [name] is a 69 y.o. female who presented to ED with RLE swelling, which started10 days prior to admission. Placed compression stockings w/ improvement. 6 days prior to admission however patient developed acute  SOB. Constant. Patient shortness of breath was worsening however with some improvement at rest. Associated with dizziness and weakness. Patient denied cough, CP, palpitations, fevers unexplained wt loss, night sweats.  Checked CBG at home about every 2 wks ago. Last check 1 wk ago and was [place]:  Pt is 69 yo female who presented to [facility] ED with main concern of right lower extremity swelling that started 10 days prior to admission. Patient has tried compression stockings for several days with minimum improvement in swelling. She has developed progressive dyspnea with exertion and at rest. She was admitted for further evaluation, noted to have pulmonary embolus and was started on heparin drip.  Hospital course prolonged due to PNA finding on CT angio chest 3/3 and new onset atrial fibrillation night of 3/5 requiring Cardizem IV. In addition, pt with more abd pain 3/5 and CT abd with mild pancreatitis confirmed by elevated lipase level 111.    #1 acute respiratory failure with hypoxia on admission, oxygen sats of 88-90% on roomair admission Patient had presented with worsening shortness of breath with acute respiratory failure with hypoxia on admission with sats in the 80s. Patient also presented with lower extremity edema. Lower extremity Dopplers which were done with consistent with a DVT. Patient was subsequently placed on a heparin drip. It was felt patient's acute respiratory failure was multifactorial secondary to acute pulmonary embolus and possible underlying pneumonia noted on CT angiogram of 03/10/2014. CT angiogram was done which showed a large saddle embolus. It was assumed on admission the patient had PE based on 2-D echo and Doppler ultrasound which was positive  for bilateral DVTs. Cardiology was consulted due to right heart strain noted on 2-D echo and recommended no further interventions other than anticoagulation. Patient was started on a heparin drip and subsequently  transitioned to full dose Lovenox. Coumadin was also started. Patient improved clinically and was maintained on a Lovenox bridge with Coumadin. INR was therapeutic for 48 hours on the Lovenox bridge. On day of discharge patient's INR was 2.20. Patient will follow-up with PCP as outpatient. As this is patient's second event patient will likely need to be on anticoagulation for the rest of her life.  #2 acute right lower extremity DVT/questionable chronic left lower extremity DVT Patient had presented with progressive shortness of breath and lower extremity edema. Lower extremity Dopplers which were done were consistent with acute DVT involving the posterior tibial, popliteal, femoral and distal to mid, femoral just distal to the saphenofemoral junction of the right lower extremity. Patient also noted to have small chronic DVT of the left lower extremity. Patient was started on anticoagulation initially placed on a heparin drip and subsequently transitioned to full dose Lovenox. Coumadin was started and patient was followed. Patient was maintained on a Lovenox bridge with Coumadin until INR was therapeutic. Patient remained on Lovenox bridge with Coumadin for 48 hours and on day of discharge INR was 2.20. Patient be discharged home on Coumadin 7.5 mg daily and is to follow-up with PCP as outpatient. As this is patient's second event patient will likely need to be on anticoagulation forever.   #3 new-onset atrial fibrillation night of 3/5 Likely secondary to acute PE. Patient received a dose of IV Cardizem overnight, and continued on home regimen of Cardizem 180 mg daily. Heart rate controlled on home regimen of Cardizem. Patient was on full dose anticoagulation secondary to PE. Outpatient follow-up.   #4 probable pneumonia/, he acquired pneumonia/lobar/inferior lingula Noted on CT chest 3/3 2016. Patient was placed initially placed on IV rocephin and azithromycin. Patient clinically improved and received a  full course of antibiotic therapy.  #5 acute pancreatitis Likely secondary to gallstone pancreatitis. Lipase levels were elevated at 111 however trending down. Patient was initially placed on IV fluids and bowel rest supportive care. Patient improved clinically was subsequently started on a clear liquid diet. Patient with no prior history of alcohol abuse. Patient improved clinically was subsequently transitioned to a solid diet which she tolerated. Patient did not have any further abdominal pain and patient be discharged home in stable condition. Patient will likely need to follow-up with general surgery as outpatient for elective cholecystectomy. Follow.  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 Oral hypoglycemics were held throughout the hospitalization. Patient wasn't in on Lantus and a sliding scale insulin. Patient be discharged home back on her home oral hypoglycemic regimen will need to follow-up with PCP as outpatient.   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 Improved with hydration. Follow.  #8 anemia of chronic disease H&H stable. Follow.  #9 hyperlipidemia Continue statin.  #10 hyponatremia Resolved.   #11 hypothyroidism Continued on home dose Synthroid.  #12 thrombocytopenia Resolved. Follow.  #13 morbid obesity  #14 sepsis secondary to UTI/commonly acquired pneumonia Criteria was met with a temperature 101.1 Fahrenheit, respiratory rate of 23, white count of 11,000 on admission. Patient was initially started on IV Rocephin. CT of the chest on 3/3 notable for possible infiltrate and as such azithromycin  was added to the regimen on 3/3. WBC trended down and had resolved by day of discharge. Patient remained afebrile. Patient received 7 days of IV Rocephin and 6 days of azithromycin. Patient does not require any further antibiotics. Patient be  discharged home in stable and improved condition.   Procedures:  CT Angio chest 03/10/2014  CT abdomen and pelvis 03/12/2014  Lower extremity Dopplers 03/08/2014  2-D echo 03/08/2014  Chest x-ray 03/07/2014    Consultations:  None  Discharge Exam: Filed Vitals:   03/15/14 1401  BP: 125/70  Pulse: 79  Temp: 97.5 F (36.4 C)  Resp: 18    General: NAD Cardiovascular: RRR Respiratory: CTAB  Discharge Instructions   Discharge Instructions    Diet Carb Modified    Complete by:  As directed      Discharge instructions    Complete by:  As directed   Follow up with MCNEILL,WENDY, MD in 1 week. Patient will have HHRN draw PT/INR on Thursday 03/17/14 and results sent to PCP.     Increase activity slowly    Complete by:  As directed           Current Discharge Medication List    START taking these medications   Details  oxyCODONE (OXY IR/ROXICODONE) 5 MG immediate release tablet Take 1 tablet (5 mg total) by mouth every 4 (four) hours as needed for moderate pain. Qty: 20 tablet, Refills: 0    warfarin (COUMADIN) 5 MG tablet Take 1.5 tablets (7.5 mg total) by mouth daily at 6 PM. Qty: 60 tablet, Refills: 0      CONTINUE these medications which have NOT CHANGED   Details  atorvastatin (LIPITOR) 20 MG tablet Take 20 mg by mouth daily.    diltiazem (CARDIZEM CD) 180 MG 24 hr capsule Take 1 capsule by mouth daily.    glimepiride (AMARYL) 4 MG tablet Take 4 mg by mouth 2 (two) times daily.    levothyroxine (SYNTHROID, LEVOTHROID) 112 MCG tablet Take 112 mcg by mouth daily before breakfast.    lisinopril (PRINIVIL,ZESTRIL) 20 MG tablet Take 20 mg by mouth daily.    metFORMIN (GLUCOPHAGE) 1000 MG tablet Take 1,000 mg by mouth 2 (two) times daily with a meal.    Multiple Vitamins-Minerals (MULTIVITAMIN WITH MINERALS) tablet Take 1 tablet by mouth daily.      STOP taking these medications     aspirin EC 81 MG tablet        No Known Allergies Follow-up  Information    Follow up with [facility].   Why:  HHRN-pt/inr due thurs 03/17/14 call lab results to pcp-Dr. Lenna Sciara [phone]   Contact information:   [address] [place] [address] [phone]       Follow up with [facility], MD. Schedule an appointment as soon as possible for a visit in 1 week.   Specialty:  Family Medicine   Contact information:   [address] [place] [address] [phone]       Follow up with [facility], MD.   Specialty:  Family Medicine   Contact information:   [address] [place] [address] [phone]       Follow up with [facility], MD.   Specialty:  Family Medicine   Contact information:   [address] [place] [address] [phone]       Follow up with MCNEILL,WENDY, MD. Go in 3 days.   Specialty:  Family Medicine   Why:  Follow-up appointment from hospital discharge    Contact information:   [place] [address] [phone]        The results of significant diagnostics from this hospitalization (including imaging, microbiology, ancillary and laboratory) are listed below for reference.    Significant Diagnostic Studies: Ct Angio Chest Pe W/cm &/or Wo Cm  03/10/2014   ADDENDUM REPORT: 03/10/2014 13:24  ADDENDUM: Critical Value/emergent results were called by telephone at the time of interpretation on 03/10/2014 at 1:24 pm to Dr. Mart Piggs , who verbally acknowledged these results.   Electronically Signed   By: Lowella Grip III M.D.   On: 03/10/2014 13:24   03/10/2014   CLINICAL DATA:  Progressive shortness of Breath  EXAM: CT ANGIOGRAPHY CHEST WITH CONTRAST  TECHNIQUE: Multidetector CT imaging of the chest was performed using the standard protocol during bolus administration of intravenous contrast. Multiplanar CT image reconstructions and MIPs were obtained to evaluate the vascular anatomy.  CONTRAST:  158mL OMNIPAQUE IOHEXOL 350 MG/ML  SOLN  COMPARISON:  Chest CT angiogram August 10, 2008; chest radiograph March 07, 2014  FINDINGS: There is extensive pulmonary embolus with emboli extending across both main pulmonary arteries and extending into multiple upper and lower lobe pulmonary artery branches bilaterally. There is right heart strain with a right ventricle to left ventricle diameter ratio of 2.0, normal less than 0.9.  There is a moderate bilateral pleural effusion with a slightly smaller left pleural effusion. There is patchy atelectatic change in each lung base. There is mild scarring in the left base. There is patchy airspace consolidation in the inferior lingula on the left.  The pericardium is not thickened. There is no appreciable adenopathy.  In the visualized upper abdomen, no lesion is seen.  There are no blastic or lytic bone lesions. Visualized thyroid is diffusely enlarged without focal lesion apparent. Central catheter tip is in the superior vena cava.  Review of the MIP images confirms the above findings.  IMPRESSION: Positive for extensive acute PE with CT evidence of right heart strain (RV/LV Ratio = Positive for acute PE with CT evidence of right heart strain (RV/LV Ratio = 2.0) consistent with at least submassive (intermediate risk)PE. The presence of right heart strain has been associated with anincreased risk of morbidity and mortality. Consultation with [place] is recommended.) consistent with at least submassive (intermediate risk) PE. The presence of right heart strain has been associated with an increased risk of morbidity and mortality.  Small area of infiltrate inferior lingula. Bilateral pleural effusions with patchy areas of atelectatic change.  Visualized thyroid diffusely enlarged.  No adenopathy.  Electronically Signed: By: Lowella Grip III M.D. On: 03/10/2014 12:39   Ct Abdomen Pelvis W Contrast  03/12/2014   CLINICAL DATA:  69 year old female abdominal and pelvic pain.  Patient diagnosed with DVT, pulmonary emboli and pneumonia.  EXAM: CT ABDOMEN AND PELVIS WITH CONTRAST  TECHNIQUE: Multidetector CT imaging of the abdomen and pelvis was performed using the standard protocol following bolus administration of intravenous contrast.  CONTRAST:  158mL OMNIPAQUE IOHEXOL 300 MG/ML  SOLN  COMPARISON:  08/09/2008 abdominal/pelvic CT.  03/10/2014 chest CT  FINDINGS: Lower chest:  A small right pleural effusion is again noted.  Hepatobiliary: The liver is unremarkable. Cholelithiasis identified without CT evidence of acute cholecystitis.  Pancreas: Mild peripancreatic stranding is suspicious for mild pancreatitis. No acute peripancreatic fluid collections, pancreatic necrosis, hemorrhage or venous thrombosis noted.  Spleen: Unremarkable  Adrenals/Urinary Tract: Mild bilateral renal  cortical atrophy identified. There is no evidence of hydronephrosis or renal mass. The adrenal glands are unremarkable. Small amount of gas in the bladder may be related to recent catheterization versus infection.  Stomach/Bowel: Colonic diverticulosis noted without diverticulitis. There is no evidence of bowel obstruction or pneumoperitoneum. The appendix is normal.  Vascular/Lymphatic: There is no evidence of abdominal aortic aneurysm or enlarged lymph nodes. The patient's known in DVT is difficult to visualize on this study.  Reproductive: Unremarkable  Other: A small amount of ascites within the abdomen and pelvis noted.  Musculoskeletal: No acute or suspicious bony abnormalities are identified.  IMPRESSION: Mild peripancreatic stranding which may represent mild pancreatitis. Correlate with labs.  Small amount of gas in the bladder -likely related to recent catheterization but infection not excluded.  Small amount of ascites within the abdomen and pelvis. Unchanged small right pleural effusion.  Cholelithiasis without CT evidence of acute cholecystitis.   Electronically Signed   By: Margarette Canada M.D.   On:  03/12/2014 19:06   Dg Chest Port 1 View  03/07/2014   CLINICAL DATA:  Acute onset of cough and chills. Shortness of breath. Initial encounter.  EXAM: PORTABLE CHEST - 1 VIEW  COMPARISON:  Chest radiograph performed 08/09/2008, and CTA of the chest from 08/10/2008  FINDINGS: The lungs are well-aerated and clear. There is no evidence of focal opacification, pleural effusion or pneumothorax.  The cardiomediastinal silhouette is borderline normal in size. No acute osseous abnormalities are seen. Scattered clips are seen overlying the right axilla.  IMPRESSION: No acute cardiopulmonary process seen.   Electronically Signed   By: Garald Balding M.D.   On: 03/07/2014 19:00    Microbiology: Recent Results (from the past 240 hour(s))  MRSA PCR Screening     Status: None   Collection Time: 03/07/14 10:50 PM  Result Value Ref Range Status   MRSA by PCR NEGATIVE NEGATIVE Final    Comment:        The GeneXpert MRSA Assay (FDA approved for NASAL specimens only), is one component of a comprehensive MRSA colonization surveillance program. It is not intended to diagnose MRSA infection nor to guide or monitor treatment for MRSA infections.   Urine culture     Status: None   Collection Time: 03/08/14  1:08 PM  Result Value Ref Range Status   Specimen Description URINE, CATHETERIZED  Final   Special Requests NONE  Final   Colony Count NO GROWTH Performed at [facility]   Final   Culture NO GROWTH Performed at [facility]   Final   Report Status 03/09/2014 FINAL  Final     Labs: Basic Metabolic Panel:  Recent Labs Lab 03/10/14 0520 03/12/14 0420 03/13/14 0315 03/14/14 0700 03/15/14 0545  NA 131* 135 134* 131* 138  K 4.3 4.0 3.8 3.6 3.7  CL 102 101 104 101 105  CO2 $Re'20 26 26 24 25  'vWF$ GLUCOSE 237* 148* 115* 147* 90  BUN 24* $Remov'15 15 13 10  'CDctgq$ CREATININE 0.96 0.83 0.83 0.83 0.75  CALCIUM 8.2* 8.3* 8.0* 7.8* 8.1*   Liver Function Tests: No results for input(s): AST, ALT,  ALKPHOS, BILITOT, PROT, ALBUMIN in the last 168 hours.  Recent Labs Lab 03/13/14 0315 03/14/14 0700  LIPASE 111* 92*   No results for input(s): AMMONIA in the last 168 hours. CBC:  Recent Labs Lab 03/11/14 0645 03/12/14 0420 03/13/14 0315 03/14/14 0700 03/15/14 0545  WBC 9.7 10.6* 8.2 7.0 7.0  HGB 11.1* 11.3* 10.3* 10.3*  10.2*  HCT 33.3* 34.5* 30.9* 30.9* 31.5*  MCV 91.2 92.2 91.4 92.2 92.9  PLT 140* 138* 136* 155 175   Cardiac Enzymes: No results for input(s): CKTOTAL, CKMB, CKMBINDEX, TROPONINI in the last 168 hours. BNP: BNP (last 3 results)  Recent Labs  03/07/14 2241  BNP 848.9*    ProBNP (last 3 results) No results for input(s): PROBNP in the last 8760 hours.  CBG:  Recent Labs Lab 03/14/14 1147 03/14/14 1655 03/14/14 2240 03/15/14 0749 03/15/14 1150  GLUCAP 167* 109* 80 86 153*       Signed:  Valerie Fredin MD [facility] 03/15/2014, 5:11 PM

## 2014-03-21 ENCOUNTER — Other Ambulatory Visit: Payer: Self-pay | Admitting: Family Medicine

## 2014-03-21 DIAGNOSIS — K851 Biliary acute pancreatitis without necrosis or infection: Secondary | ICD-10-CM

## 2014-04-08 ENCOUNTER — Other Ambulatory Visit: Payer: Medicare Other

## 2014-04-12 ENCOUNTER — Ambulatory Visit
Admission: RE | Admit: 2014-04-12 | Discharge: 2014-04-12 | Disposition: A | Discharge status: Home / Self Care | Payer: Medicare Other | Source: Ambulatory Visit | Attending: Family Medicine | Admitting: Family Medicine

## 2014-04-12 DIAGNOSIS — K851 Biliary acute pancreatitis without necrosis or infection: Secondary | ICD-10-CM

## 2014-04-29 ENCOUNTER — Ambulatory Visit: Payer: Self-pay | Admitting: Surgery

## 2014-09-09 ENCOUNTER — Ambulatory Visit: Payer: Self-pay | Admitting: Surgery

## 2014-09-09 NOTE — H&P (Signed)
[name] 09/09/2014 10:44 AM Location: [facility] Patient #: [id number] DOB: [birth year] Married / Language: English / Race: White Female History of Present Illness Marcello Moores A. Tee Richeson MD; 09/09/2014 11:10 AM) Patient words: reck gallstone pancreatitis Pt doing well without any severe episodes of abdominal pain since last visit. 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 No change in diet, nausea or vomiting. Some mild pressure from time to time.              Results for CRYSTELLE, FERRUFINO (MRN [id number]) as of 09/09/2014 11:07 Ref. Range 03/13/2014 03:15 Sodium Latest Ref Range: 135-145 mmol/L 134 (L) Potassium Latest Ref Range: 3.5-5.1 mmol/L 3.8 Chloride Latest Ref Range: 96-112 mmol/L 104 CO2 Latest Ref Range: 19-32 mmol/L 26 BUN Latest Ref Range: 6-23 mg/dL 15 Creatinine Latest Ref Range: 0.50-1.10 mg/dL 0.83 Calcium Latest Ref Range: 8.4-10.5 mg/dL 8.0 (L) EGFR (Non-African Amer.) Latest Ref Range: >90 mL/min 70 (L) EGFR (African American) Latest Ref Range: >90 mL/min 82 (L) Glucose Latest Ref Range: 70-99 mg/dL 115 (H) Anion gap Latest Ref Range: 5-15 4 (L) Lipase Latest Ref Range: 11-59 U/L 111 (H) WBC Latest Ref Range: 4.0-10.5 K/uL 8.2 RBC Latest Ref Range: 3.87-5.11 MIL/uL 3.38 (L) Hemoglobin Latest Ref Range: 12.0-15.0 g/dL 10.3 (L) HCT Latest Ref Range: 36.0-46.0 % 30.9 (L) MCV Latest Ref Range: 78.0-100.0 fL 91.4 MCH Latest Ref Range: 26.0-34.0 pg 30.5 MCHC Latest Ref Range: 30.0-36.0 g/dL 33.3 RDW Latest Ref Range: 11.5-15.5 % 14.1 Platelets Latest Ref Range: 150-400 K/uL 136 (L) Prothrombin Time Latest Ref Range: 11.6-15.2 seconds 22.0 (H) INR Latest Ref Range: 0.00-1.49 1.90 (H)              CLINICAL DATA: Recent CT scan demonstrating gallstones ; history of pancreatitis ; currently asymptomatic.  EXAM: ULTRASOUND ABDOMEN COMPLETE  COMPARISON: Abdominal pelvic CT scan of May 12, 2014  FINDINGS: Gallbladder: The  gallbladder is adequately distended. There are multiple mobile echogenic shadowing stones. The largest measures 7 mm in diameter. There is no gallbladder wall thickening, pericholecystic fluid, or positive sonographic Murphy's sign.  Common bile duct: Diameter: 4.7-5.9 mm. No intraluminal stones are demonstrated.  Liver: The hepatic echotexture is heterogeneous. There is no focal mass nor ductal dilation.  IVC: No abnormality visualized.  Pancreas: Evaluation of the pancreas was limited by bowel gas.  Spleen: Size and appearance within normal limits.  Right Kidney: Length: 11.7 cm. Echogenicity within normal limits. No mass or hydronephrosis visualized.  Left Kidney: Length: 10.7 cm. Echogenicity within normal limits. No mass or hydronephrosis visualized.  Abdominal aorta: No aneurysm visualized.  Other findings: No ascites is demonstrated today.  IMPRESSION: 1. Gallstones without sonographic evidence of acute cholecystitis. 2. No acute abnormality is demonstrated elsewhere within the abdomen. Evaluation of the pancreas was limited.   Electronically Signed By: David Martinique On: 04/12/2014 09:34.  The patient is a 69 year old female   Allergies [name], [place]; 09/09/2014 10:45 AM) No Known Drug Allergies 04/29/2014  Medication History ([name], [name]; 09/09/2014 10:45 AM) OxyCODONE HCl (5MG  Tablet, Oral) Active. Acetaminophen-Codeine #3 (300-30MG  Tablet, Oral) Active. Atorvastatin Calcium (20MG  Tablet, Oral) Active. Clindamycin HCl (150MG  Capsule, Oral) Active. Diltiazem HCl ER Coated Beads (180MG  Capsule ER 24HR, Oral) Active. Glimepiride (4MG  Tablet, Oral) Active. Levothyroxine Sodium (112MCG Tablet, Oral) Active. Lisinopril (20MG  Tablet, Oral) Active. MetFORMIN HCl (1000MG  Tablet, Oral) Active. Warfarin Sodium (5MG  Tablet, Oral) Active. Medications Reconciled    Vitals ([name] [name]; 09/09/2014 10:45 AM) 09/09/2014 10:44 AM Weight:  239 lb  Height: 70in Body Surface Area: 2.31 m Body Mass Index: 34.29 kg/m Temp.: 97.28F(Temporal)  Pulse: 79 (Regular)  BP: 132/80 (Sitting, Left Arm, Standard)     Physical Exam (Keoshia Steinmetz A. Jadore Mcguffin MD; 09/09/2014 11:10 AM)  General Mental Status-Alert. General Appearance-Consistent with stated age. Hydration-Well hydrated. Voice-Normal.  Head and Neck Head-normocephalic, atraumatic with no lesions or palpable masses.  Eye Eyeball - Bilateral-Extraocular movements intact. Sclera/Conjunctiva - Bilateral-No scleral icterus.  Chest and Lung Exam Chest and lung exam reveals -quiet, even and easy respiratory effort with no use of accessory muscles and on auscultation, normal breath sounds, no adventitious sounds and normal vocal resonance. Inspection Chest Wall - Normal. Back - normal.  Cardiovascular Cardiovascular examination reveals -on palpation PMI is normal in location and amplitude, no palpable S3 or S4. Normal cardiac borders., normal heart sounds, regular rate and rhythm with no murmurs, carotid auscultation reveals no bruits and normal pedal pulses bilaterally.  Abdomen Inspection Inspection of the abdomen reveals - No Hernias. Skin - Scar - no surgical scars. Palpation/Percussion Palpation and Percussion of the abdomen reveal - Soft, Non Tender, No Rebound tenderness, No Rigidity (guarding) and No hepatosplenomegaly. Auscultation Auscultation of the abdomen reveals - Bowel sounds normal.  Neurologic Neurologic evaluation reveals -alert and oriented x 3 with no impairment of recent or remote memory. Mental Status-Normal.  Musculoskeletal Normal Exam - Left-Upper Extremity Strength Normal and Lower Extremity Strength Normal. Normal Exam - Right-Upper Extremity Strength Normal, Lower Extremity Weakness.    Assessment & Plan (Dashonna Chagnon A. 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 Risks of laparoscopic cholecystectomy include bleeding, infection, bile duct injury, leak, death, open surgery, diarrhea, other surgery, organ injury, blood vessel injury, DVT, and additional care.  Current Plans Pt Education - Laparoscopic cholecystectomy: discussed with patient and provided information. 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

## 2014-10-17 ENCOUNTER — Encounter (HOSPITAL_COMMUNITY): Payer: Self-pay

## 2014-10-17 ENCOUNTER — Encounter (HOSPITAL_COMMUNITY)
Admission: RE | Admit: 2014-10-17 | Discharge: 2014-10-17 | Disposition: A | Discharge status: Home / Self Care | Payer: Medicare Other | Source: Ambulatory Visit | Attending: Surgery | Admitting: Surgery

## 2014-10-17 DIAGNOSIS — K8010 Calculus of gallbladder with chronic cholecystitis without obstruction: Secondary | ICD-10-CM | POA: Diagnosis not present

## 2014-10-17 HISTORY — DX: Calculus of gallbladder without cholecystitis without obstruction: K80.20

## 2014-10-17 HISTORY — DX: Nausea with vomiting, unspecified: R11.2

## 2014-10-17 HISTORY — DX: Pneumonia, unspecified organism: J18.9

## 2014-10-17 HISTORY — DX: Other specified postprocedural states: Z98.890

## 2014-10-17 LAB — COMPREHENSIVE METABOLIC PANEL
ALK PHOS: 67 U/L (ref 38–126)
ALT: 26 U/L (ref 14–54)
AST: 39 U/L (ref 15–41)
Albumin: 3.6 g/dL (ref 3.5–5.0)
Anion gap: 7 (ref 5–15)
BILIRUBIN TOTAL: 0.4 mg/dL (ref 0.3–1.2)
BUN: 12 mg/dL (ref 6–20)
CALCIUM: 9.5 mg/dL (ref 8.9–10.3)
CHLORIDE: 105 mmol/L (ref 101–111)
CO2: 28 mmol/L (ref 22–32)
CREATININE: 0.89 mg/dL (ref 0.44–1.00)
Glucose, Bld: 112 mg/dL — ABNORMAL HIGH (ref 65–99)
Potassium: 3.9 mmol/L (ref 3.5–5.1)
Sodium: 140 mmol/L (ref 135–145)
TOTAL PROTEIN: 7.2 g/dL (ref 6.5–8.1)

## 2014-10-17 LAB — CBC WITH DIFFERENTIAL/PLATELET
Basophils Absolute: 0.1 10*3/uL (ref 0.0–0.1)
Basophils Relative: 1 %
Eosinophils Absolute: 0.2 10*3/uL (ref 0.0–0.7)
Eosinophils Relative: 4 %
HCT: 40.9 % (ref 36.0–46.0)
HEMOGLOBIN: 13.6 g/dL (ref 12.0–15.0)
LYMPHS ABS: 1.5 10*3/uL (ref 0.7–4.0)
LYMPHS PCT: 29 %
MCH: 30.5 pg (ref 26.0–34.0)
MCHC: 33.3 g/dL (ref 30.0–36.0)
MCV: 91.7 fL (ref 78.0–100.0)
Monocytes Absolute: 0.6 10*3/uL (ref 0.1–1.0)
Monocytes Relative: 11 %
Neutro Abs: 3 10*3/uL (ref 1.7–7.7)
Neutrophils Relative %: 55 %
Platelets: 178 10*3/uL (ref 150–400)
RBC: 4.46 MIL/uL (ref 3.87–5.11)
RDW: 13.9 % (ref 11.5–15.5)
WBC: 5.4 10*3/uL (ref 4.0–10.5)

## 2014-10-17 LAB — APTT: aPTT: 34 seconds (ref 24–37)

## 2014-10-17 LAB — GLUCOSE, CAPILLARY: GLUCOSE-CAPILLARY: 147 mg/dL — AB (ref 65–99)

## 2014-10-17 LAB — PROTIME-INR
INR: 1.12 (ref 0.00–1.49)
Prothrombin Time: 14.6 seconds (ref 11.6–15.2)

## 2014-10-17 NOTE — Pre-Procedure Instructions (Signed)
[name]  10/17/2014     Your procedure is scheduled on : Tuesday October 18, 2014 at 11:10 AM.  Report to [facility] Admitting at 9:10 A.M.  Call this number if you have problems the morning of surgery: [phone]   Remember:  Do not eat food or drink liquids after midnight.  Take these medicines the morning of surgery with A SIP OF WATER : Diltiazem (Cardizem), Levothyroxine (Synthroid)   Please bring your advanced directive the morning of your surgery   Please follow your Physicians orders regarding Lovenox and Coumadin   Stop taking any aspirin, Coumadin, herbal medications, Ibuprofen, Advil, Motrin, Aleve, etc   Do NOT take any diabetic pills the morning of your surgery (NO Glimepiride/Amaryl or Metformin/Glucophage)  How to Manage Your Diabetes Before Surgery   Why is it important to control my blood sugar before and after surgery?   Improving blood sugar levels before and after surgery helps healing and can limit problems.  A way of improving blood sugar control is eating a healthy diet by:  - Eating less sugar and carbohydrates  - Increasing activity/exercise  - Talk with your doctor about reaching your blood sugar goals  High blood sugars (greater than 180 mg/dL) can raise your risk of infections and slow down your recovery so you will need to focus on controlling your diabetes during the weeks before surgery.  Make sure that the doctor who takes care of your diabetes knows about your planned surgery including the date and location.  How do I manage my blood sugars before surgery?   Check your blood sugar at least 4 times a day, 2 days before surgery to make sure that they are not too high or low.   Check your blood sugar the morning of your surgery when you wake up and every 2 hours until you get to the Short-Stay unit.  If your blood sugar is less than 70 mg/dL, you will need to treat for low blood sugar by:  Treat a low blood sugar (less  than 70 mg/dL) with 1/2 cup of clear juice (cranberry or apple), 4 glucose tablets, OR glucose gel.  Recheck blood sugar in 15 minutes after treatment (to make sure it is greater than 70 mg/dL).  If blood sugar is not greater than 70 mg/dL on re-check, call [phone] for further instructions.   Report your blood sugar to the Short-Stay nurse when you get to Short-Stay.  References:  [facility], 2007 "How to Manage your Diabetes Before and After Surgery".  What do I do about my diabetes medications?   Do not take oral diabetes medicines (pills) the morning of surgery.    Do not wear jewelry, make-up or nail polish.  Do not wear lotions, powders, or perfumes.    Do not shave 48 hours prior to surgery.    Do not bring valuables to the hospital.  [facility] is not responsible for any belongings or valuables.  Contacts, dentures or bridgework may not be worn into surgery.  Leave your suitcase in the car.  After surgery it may be brought to your room.  For patients admitted to the hospital, discharge time will be determined by your treatment team.  Patients discharged the day of surgery will not be allowed to drive home.   Name and phone number of your driver:    Special instructions:  Shower using CHG soap the night before and the morning of your surgery  Please read over the following fact sheets that you were given. Pain Booklet, Coughing and Deep Breathing and Surgical Site Infection Prevention

## 2014-10-17 NOTE — Progress Notes (Signed)
PCP is Gweneth Dimitri  Patient denied having any acute cardiac or pulmonary issues  Nurse inquired about fasting blood glucose, and patient stated the highest her blood sugar has been was 220 and the lowest was 87. CBG on arrival to PAT was 147. Patient stated she had a A1C done "a few weeks ago" at her PCP's office. Will request records.  Patient informed Nurse that she stopped taking Coumadin on 10/12/14 and started Lovenox on 10/13/14  Husband at chair side during PAT appointment

## 2014-10-18 ENCOUNTER — Ambulatory Visit (HOSPITAL_COMMUNITY): Payer: Medicare Other | Admitting: Anesthesiology

## 2014-10-18 ENCOUNTER — Ambulatory Visit (HOSPITAL_COMMUNITY): Payer: Medicare Other

## 2014-10-18 ENCOUNTER — Ambulatory Visit (HOSPITAL_BASED_OUTPATIENT_CLINIC_OR_DEPARTMENT_OTHER): Payer: Medicare Other

## 2014-10-18 ENCOUNTER — Ambulatory Visit (HOSPITAL_COMMUNITY)
Admission: RE | Admit: 2014-10-18 | Discharge: 2014-10-18 | Disposition: A | Discharge status: Home / Self Care | Payer: Medicare Other | Source: Ambulatory Visit | Attending: Surgery | Admitting: Surgery

## 2014-10-18 ENCOUNTER — Encounter (HOSPITAL_COMMUNITY): Payer: Self-pay | Admitting: Certified Registered Nurse Anesthetist

## 2014-10-18 ENCOUNTER — Encounter (HOSPITAL_COMMUNITY)
Admission: RE | Disposition: A | Discharge status: Home / Self Care | Payer: Self-pay | Source: Ambulatory Visit | Attending: Surgery

## 2014-10-18 DIAGNOSIS — Z01810 Encounter for preprocedural cardiovascular examination: Secondary | ICD-10-CM | POA: Diagnosis not present

## 2014-10-18 DIAGNOSIS — Z419 Encounter for procedure for purposes other than remedying health state, unspecified: Secondary | ICD-10-CM

## 2014-10-18 DIAGNOSIS — K8010 Calculus of gallbladder with chronic cholecystitis without obstruction: Secondary | ICD-10-CM | POA: Diagnosis not present

## 2014-10-18 HISTORY — PX: CHOLECYSTECTOMY: SHX55

## 2014-10-18 LAB — GLUCOSE, CAPILLARY
GLUCOSE-CAPILLARY: 126 mg/dL — AB (ref 65–99)
Glucose-Capillary: 134 mg/dL — ABNORMAL HIGH (ref 65–99)

## 2014-10-18 SURGERY — LAPAROSCOPIC CHOLECYSTECTOMY WITH INTRAOPERATIVE CHOLANGIOGRAM
Anesthesia: General | Site: Abdomen

## 2014-10-18 MED ORDER — MIDAZOLAM HCL 5 MG/5ML IJ SOLN
INTRAMUSCULAR | Status: DC | PRN
  Administered 2014-10-18: 2 mg via INTRAVENOUS

## 2014-10-18 MED ORDER — LIDOCAINE HCL (CARDIAC) 20 MG/ML IV SOLN
INTRAVENOUS | Status: DC | PRN
  Administered 2014-10-18: 60 mg via INTRATRACHEAL
  Administered 2014-10-18: 60 mg via INTRAVENOUS

## 2014-10-18 MED ORDER — SODIUM CHLORIDE 0.9 % IV SOLN
INTRAVENOUS | Status: DC | PRN
  Administered 2014-10-18: 20 mL

## 2014-10-18 MED ORDER — VECURONIUM BROMIDE 10 MG IV SOLR
INTRAVENOUS | Status: AC
  Filled 2014-10-18: qty 10

## 2014-10-18 MED ORDER — LACTATED RINGERS IV SOLN
INTRAVENOUS | Status: DC
Start: 2014-10-18 — End: 2014-10-18
  Administered 2014-10-18 (×2): via INTRAVENOUS

## 2014-10-18 MED ORDER — MIDAZOLAM HCL 2 MG/2ML IJ SOLN
INTRAMUSCULAR | Status: AC
  Filled 2014-10-18: qty 4

## 2014-10-18 MED ORDER — HYDROMORPHONE HCL 1 MG/ML IJ SOLN: 0.2500 mg | INTRAMUSCULAR | Status: DC | PRN

## 2014-10-18 MED ORDER — OXYCODONE-ACETAMINOPHEN 5-325 MG PO TABS
1.0000 | ORAL_TABLET | ORAL | Status: DC | PRN
Start: 2014-10-18 — End: 2021-04-10

## 2014-10-18 MED ORDER — BUPIVACAINE-EPINEPHRINE (PF) 0.25% -1:200000 IJ SOLN
INTRAMUSCULAR | Status: AC
  Filled 2014-10-18: qty 30

## 2014-10-18 MED ORDER — GLYCOPYRROLATE 0.2 MG/ML IJ SOLN
INTRAMUSCULAR | Status: DC | PRN
  Administered 2014-10-18: 0.4 mg via INTRAVENOUS
  Administered 2014-10-18: 0.2 mg via INTRAVENOUS

## 2014-10-18 MED ORDER — DEXAMETHASONE SODIUM PHOSPHATE 4 MG/ML IJ SOLN
INTRAMUSCULAR | Status: AC
  Filled 2014-10-18: qty 1

## 2014-10-18 MED ORDER — 0.9 % SODIUM CHLORIDE (POUR BTL) OPTIME
TOPICAL | Status: DC | PRN
  Administered 2014-10-18: 1000 mL

## 2014-10-18 MED ORDER — ONDANSETRON HCL 4 MG/2ML IJ SOLN
INTRAMUSCULAR | Status: DC | PRN
Start: 2014-10-18 — End: 2014-10-18
  Administered 2014-10-18: 4 mg via INTRAVENOUS

## 2014-10-18 MED ORDER — GLYCOPYRROLATE 0.2 MG/ML IJ SOLN
INTRAMUSCULAR | Status: AC
  Filled 2014-10-18: qty 2

## 2014-10-18 MED ORDER — ROCURONIUM BROMIDE 100 MG/10ML IV SOLN
INTRAVENOUS | Status: DC | PRN
  Administered 2014-10-18: 50 mg via INTRAVENOUS

## 2014-10-18 MED ORDER — SCOPOLAMINE 1 MG/3DAYS TD PT72
MEDICATED_PATCH | TRANSDERMAL | Status: AC
  Filled 2014-10-18: qty 1

## 2014-10-18 MED ORDER — FENTANYL CITRATE (PF) 250 MCG/5ML IJ SOLN
INTRAMUSCULAR | Status: AC
  Filled 2014-10-18: qty 5

## 2014-10-18 MED ORDER — DEXAMETHASONE SODIUM PHOSPHATE 4 MG/ML IJ SOLN
INTRAMUSCULAR | Status: DC | PRN
  Administered 2014-10-18: 4 mg via INTRAVENOUS

## 2014-10-18 MED ORDER — FENTANYL CITRATE (PF) 100 MCG/2ML IJ SOLN
INTRAMUSCULAR | Status: DC | PRN
  Administered 2014-10-18: 100 ug via INTRAVENOUS
  Administered 2014-10-18: 50 ug via INTRAVENOUS

## 2014-10-18 MED ORDER — HEMOSTATIC AGENTS (NO CHARGE) OPTIME
TOPICAL | Status: DC | PRN
  Administered 2014-10-18: 1

## 2014-10-18 MED ORDER — ONDANSETRON HCL 4 MG/2ML IJ SOLN
INTRAMUSCULAR | Status: AC
  Filled 2014-10-18: qty 2

## 2014-10-18 MED ORDER — PROPOFOL 10 MG/ML IV BOLUS
INTRAVENOUS | Status: DC | PRN
  Administered 2014-10-18: 120 mg via INTRAVENOUS

## 2014-10-18 MED ORDER — SODIUM CHLORIDE 0.9 % IR SOLN
Status: DC | PRN
  Administered 2014-10-18: 1000 mL

## 2014-10-18 MED ORDER — ROCURONIUM BROMIDE 50 MG/5ML IV SOLN
INTRAVENOUS | Status: AC
  Filled 2014-10-18: qty 1

## 2014-10-18 MED ORDER — ARTIFICIAL TEARS OP OINT
TOPICAL_OINTMENT | OPHTHALMIC | Status: AC
  Filled 2014-10-18: qty 3.5

## 2014-10-18 MED ORDER — BUPIVACAINE-EPINEPHRINE 0.25% -1:200000 IJ SOLN
INTRAMUSCULAR | Status: DC | PRN
  Administered 2014-10-18: 8 mL

## 2014-10-18 MED ORDER — DEXTROSE 5 % IV SOLN
3.0000 g | INTRAVENOUS | Status: AC
  Administered 2014-10-18: 3 g via INTRAVENOUS
  Filled 2014-10-18: qty 3000

## 2014-10-18 MED ORDER — NEOSTIGMINE METHYLSULFATE 10 MG/10ML IV SOLN
INTRAVENOUS | Status: DC | PRN
  Administered 2014-10-18: 3 mg via INTRAVENOUS

## 2014-10-18 MED ORDER — GLYCOPYRROLATE 0.2 MG/ML IJ SOLN
INTRAMUSCULAR | Status: AC
  Filled 2014-10-18: qty 1

## 2014-10-18 MED ORDER — PROPOFOL 10 MG/ML IV BOLUS
INTRAVENOUS | Status: AC
  Filled 2014-10-18: qty 20

## 2014-10-18 MED ORDER — CHLORHEXIDINE GLUCONATE 4 % EX LIQD: 1.0000 "application " | Freq: Once | CUTANEOUS | Status: DC

## 2014-10-18 MED ORDER — NEOSTIGMINE METHYLSULFATE 10 MG/10ML IV SOLN
INTRAVENOUS | Status: AC
  Filled 2014-10-18: qty 1

## 2014-10-18 MED ORDER — EPHEDRINE SULFATE 50 MG/ML IJ SOLN
INTRAMUSCULAR | Status: DC | PRN
  Administered 2014-10-18: 10 mg via INTRAVENOUS

## 2014-10-18 MED ORDER — SCOPOLAMINE 1 MG/3DAYS TD PT72
MEDICATED_PATCH | TRANSDERMAL | Status: DC | PRN
  Administered 2014-10-18: 1 via TRANSDERMAL

## 2014-10-18 SURGICAL SUPPLY — 39 items
APPLIER CLIP ROT 10 11.4 M/L (STAPLE) ×3
BLADE SURG ROTATE 9660 (MISCELLANEOUS) IMPLANT
CANISTER SUCTION 2500CC (MISCELLANEOUS) ×3 IMPLANT
CHLORAPREP W/TINT 26ML (MISCELLANEOUS) ×3 IMPLANT
CLIP APPLIE ROT 10 11.4 M/L (STAPLE) ×1 IMPLANT
COVER MAYO STAND STRL (DRAPES) ×3 IMPLANT
COVER SURGICAL LIGHT HANDLE (MISCELLANEOUS) ×3 IMPLANT
DRAPE C-ARM 42X72 X-RAY (DRAPES) ×3 IMPLANT
DRAPE LAPAROSCOPIC ABDOMINAL (DRAPES) ×3 IMPLANT
DRAPE WARM FLUID 44X44 (DRAPE) ×3 IMPLANT
ELECT REM PT RETURN 9FT ADLT (ELECTROSURGICAL) ×3
ELECTRODE REM PT RTRN 9FT ADLT (ELECTROSURGICAL) ×1 IMPLANT
GLOVE BIO SURGEON STRL SZ8 (GLOVE) ×3 IMPLANT
GLOVE BIOGEL PI IND STRL 8 (GLOVE) ×1 IMPLANT
GLOVE BIOGEL PI INDICATOR 8 (GLOVE) ×2
GOWN STRL REUS W/ TWL LRG LVL3 (GOWN DISPOSABLE) ×1 IMPLANT
GOWN STRL REUS W/ TWL XL LVL3 (GOWN DISPOSABLE) ×2 IMPLANT
GOWN STRL REUS W/TWL LRG LVL3 (GOWN DISPOSABLE) ×2
GOWN STRL REUS W/TWL XL LVL3 (GOWN DISPOSABLE) ×4
HEMOSTAT SNOW SURGICEL 2X4 (HEMOSTASIS) ×3 IMPLANT
KIT BASIN OR (CUSTOM PROCEDURE TRAY) ×3 IMPLANT
KIT ROOM TURNOVER OR (KITS) ×3 IMPLANT
LIQUID BAND (GAUZE/BANDAGES/DRESSINGS) ×3 IMPLANT
NS IRRIG 1000ML POUR BTL (IV SOLUTION) ×6 IMPLANT
PAD ARMBOARD 7.5X6 YLW CONV (MISCELLANEOUS) ×3 IMPLANT
POUCH SPECIMEN RETRIEVAL 10MM (ENDOMECHANICALS) ×3 IMPLANT
SCISSORS LAP 5X35 DISP (ENDOMECHANICALS) ×3 IMPLANT
SET CHOLANGIOGRAPH 5 50 .035 (SET/KITS/TRAYS/PACK) ×3 IMPLANT
SET IRRIG TUBING LAPAROSCOPIC (IRRIGATION / IRRIGATOR) ×3 IMPLANT
SLEEVE ENDOPATH XCEL 5M (ENDOMECHANICALS) ×3 IMPLANT
SPECIMEN JAR SMALL (MISCELLANEOUS) ×3 IMPLANT
SUT MNCRL AB 4-0 PS2 18 (SUTURE) ×3 IMPLANT
TOWEL OR 17X24 6PK STRL BLUE (TOWEL DISPOSABLE) ×3 IMPLANT
TOWEL OR 17X26 10 PK STRL BLUE (TOWEL DISPOSABLE) ×3 IMPLANT
TRAY LAPAROSCOPIC MC (CUSTOM PROCEDURE TRAY) ×3 IMPLANT
TROCAR XCEL BLUNT TIP 100MML (ENDOMECHANICALS) ×3 IMPLANT
TROCAR XCEL NON-BLD 11X100MML (ENDOMECHANICALS) ×3 IMPLANT
TROCAR XCEL NON-BLD 5MMX100MML (ENDOMECHANICALS) ×3 IMPLANT
TUBING INSUFFLATION (TUBING) ×3 IMPLANT

## 2014-10-18 NOTE — Op Note (Signed)
Laparoscopic Cholecystectomy with IOC Procedure Note  Indications: This patient presents with symptomatic gallbladder disease and will undergo laparoscopic cholecystectomy. The procedure has been discussed with the patient. Operative and non operative treatments have been discussed. Risks of surgery include bleeding, infection,  Common bile duct injury,  Injury to the stomach,liver, colon,small intestine, abdominal wall,  Diaphragm,  Major blood vessels,  Duodenum,  And the need for an open procedure.  Other risks include worsening of medical problems, death,  DVT and pulmonary embolism, and cardiovascular events.   Medical options have also been discussed. The patient has been informed of long term expectations of surgery and non surgical options,  The patient agrees to proceed.    Pre-operative Diagnosis: GALLSTONE PANCREATITIS   Post-operative Diagnosis: SAME   Surgeon: Ronell Boldin A.   Assistants: Hitchcock RNFA   Anesthesia: General endotracheal anesthesia and Local anesthesia 0.25.% bupivacaine, with epinephrine  ASA Class: 3  Procedure Details  The patient was seen again in the Holding Room. The risks, benefits, complications, treatment options, and expected outcomes were discussed with the patient. The possibilities of reaction to medication, pulmonary aspiration, perforation of viscus, bleeding, recurrent infection, finding a normal gallbladder, the need for additional procedures, failure to diagnose a condition, the possible need to convert to an open procedure, and creating a complication requiring transfusion or operation were discussed with the patient. The patient and/or family concurred with the proposed plan, giving informed consent. The site of surgery properly noted/marked. The patient was taken to Operating Room, identified as [name] and the procedure verified as Laparoscopic Cholecystectomy with Intraoperative Cholangiograms. A Time Out was held and the above  information confirmed.  Prior to the induction of general anesthesia, antibiotic prophylaxis was administered. General endotracheal anesthesia was then administered and tolerated well. After the induction, the abdomen was prepped in the usual sterile fashion. The patient was positioned in the supine position with the left arm comfortably tucked, along with some reverse Trendelenburg.  Local anesthetic agent was injected into the skin near the umbilicus and an incision made. The midline fascia was incised and the Hasson technique was used to introduce a 12 mm port under direct vision. It was secured with a figure of eight Vicryl suture placed in the usual fashion. Pneumoperitoneum was then created with CO2 and tolerated well without any adverse changes in the patient's vital signs. Additional trocars were introduced under direct vision with an 11 mm trocar in the epigastrium and two  5 mm trocars in the right upper quadrant. All skin incisions were infiltrated with a local anesthetic agent before making the incision and placing the trocars.   The gallbladder was identified, the fundus grasped and retracted cephalad. Adhesions were lysed bluntly and with the electrocautery where indicated, taking care not to injure any adjacent organs or viscus. The infundibulum was grasped and retracted laterally, exposing the peritoneum overlying the triangle of Calot. This was then divided and exposed in a blunt fashion. The cystic duct was clearly identified and bluntly dissected circumferentially. The junctions of the gallbladder, cystic duct and common bile duct were clearly identified prior to the division of any linear structure.   An incision was made in the cystic duct and the cholangiogram catheter introduced. The catheter was secured using an endoclip. The study showed no stones and good visualization of the distal and proximal biliary tree. The catheter was then removed.   The cystic duct was then  ligated with  surgical clips  on the patient side and  clipped on the gallbladder side and divided. The cystic artery was identified, dissected free, ligated with clips and divided as well. Posterior cystic artery clipped and divided.  The gallbladder was dissected from the liver bed in retrograde fashion with the electrocautery. The gallbladder was removed with Endocatch bag . The liver bed was irrigated and inspected. Hemostasis was achieved with the electrocautery. Copious irrigation was utilized and was repeatedly aspirated until clear all particulate matter. Hemostasis was achieved with no signs  Of bleeding or bile leakage.  Pneumoperitoneum was completely reduced after viewing removal of the trocars under direct vision. The wound was thoroughly irrigated and the fascia was then closed with a figure of eight suture; the skin was then closed with 4 O MONOCRYL  and LIQUID ADHESIVE  was applied.  Instrument, sponge, and needle counts were correct at closure and at the conclusion of the case.   Findings: Cholelithiasis  Estimated Blood Loss: Minimal         Drains: NONE          Total IV Fluids: 700 mL         Specimens: Gallbladder           Complications: None; patient tolerated the procedure well.         Disposition: PACU - hemodynamically stable.         Condition: stable

## 2014-10-18 NOTE — Discharge Instructions (Signed)
[facility]: POST OP INSTRUCTIONS Always review your discharge instruction sheet given to you by the facility where your surgery was performed. IF YOU HAVE DISABILITY OR FAMILY LEAVE FORMS, YOU MUST BRING THEM TO THE OFFICE FOR PROCESSING.   DO NOT GIVE THEM TO YOUR DOCTOR.  1. A prescription for pain medication may be given to you upon discharge.  Take your pain medication as prescribed, if needed.  If narcotic pain medicine is not needed, then you may take acetaminophen (Tylenol) or ibuprofen (Advil) as needed. 2. Take your usually prescribed medications unless otherwise directed. 3. If you need a refill on your pain medication, please contact your pharmacy.  They will contact our office to request authorization. Prescriptions will not be filled after 5pm or on week-ends. 4. You should follow a light diet the first few days after arrival home, such as soup and crackers, etc.  Be sure to include lots of fluids daily. 5. Most patients will experience some swelling and bruising in the area of the incisions.  Ice packs will help.  Swelling and bruising can take several days to resolve.  6. It is common to experience some constipation if taking pain medication after surgery.  Increasing fluid intake and taking a stool softener (such as Colace) will usually help or prevent this problem from occurring.  A mild laxative (Milk of Magnesia or Miralax) should be taken according to package instructions if there are no bowel movements after 48 hours. 7. Unless discharge instructions indicate otherwise, you may remove your bandages 24-48 hours after surgery, and you may shower at that time.  You may have steri-strips (small skin tapes) in place directly over the incision.  These strips should be left on the skin for 7-10 days.  If your surgeon used skin glue on the incision, you may shower in 24 hours.  The glue will flake off over the next 2-3 weeks.  Any sutures or  staples will be removed at the office during your follow-up visit. 8. ACTIVITIES:  You may resume regular (light) daily activities beginning the next day--such as daily self-care, walking, climbing stairs--gradually increasing activities as tolerated.  You may have sexual intercourse when it is comfortable.  Refrain from any heavy lifting or straining until approved by your doctor. a. You may drive when you are no longer taking prescription pain medication, you can comfortably wear a seatbelt, and you can safely maneuver your car and apply brakes. b. RETURN TO WORK:  _________2 WEEKS _________________________________________________ 9. You should see your doctor in the office for a follow-up appointment approximately 2-3 weeks after your surgery.  Make sure that you call for this appointment within a day or two after you arrive home to insure a convenient appointment time. 10. OTHER INSTRUCTIONS: __________________________________________________________________________________________________________________________ __________________________________________________________________________________________________________________________ WHEN TO CALL YOUR DOCTOR: 1. Fever over 101.0 2. Inability to urinate 3. Continued bleeding from incision. 4. Increased pain, redness, or drainage from the incision. 5. Increasing abdominal pain  The clinic staff is available to answer your questions during regular business hours.  Please dont hesitate to call and ask to speak to one of the nurses for clinical concerns.  If you have a medical emergency, go to the nearest emergency room or call 911.  A surgeon from [facility] is always on call at the hospital. [address], [place], [place]  [address] ? [address] [address], [place], [place]   [address] [phone] ? [phone] ? FAX [phone]  Web site: [email or web address]       Restart Lovanx on noon wed Restart  coumadin PM WED   CALL PRIMARY CARE TO GET FOLLOW UP FOR BLOOD THINNER

## 2014-10-18 NOTE — Interval H&P Note (Signed)
History and Physical Interval Note:  10/18/2014 10:55 AM  [name]  has presented today for surgery, with the diagnosis of Gallstones and Pancreatitis  The various methods of treatment have been discussed with the patient and family. After consideration of risks, benefits and other options for treatment, the patient has consented to  Procedure(s): LAPAROSCOPIC CHOLECYSTECTOMY WITH INTRAOPERATIVE CHOLANGIOGRAM (N/A) as a surgical intervention .  The patient's history has been reviewed, patient examined, no change in status, stable for surgery.  I have reviewed the patient's chart and labs.  Questions were answered to the patient's satisfaction.     Salimatou Simone A.

## 2014-10-18 NOTE — Anesthesia Postprocedure Evaluation (Signed)
  Anesthesia Post-op Note  Patient: [name]  Procedure(s) Performed: Procedure(s): LAPAROSCOPIC CHOLECYSTECTOMY WITH INTRAOPERATIVE CHOLANGIOGRAM  Patient Location: PACU  Anesthesia Type:General  Level of Consciousness: awake and alert   Airway and Oxygen Therapy: Patient Spontanous Breathing  Post-op Pain: Controlled  Post-op Assessment: Post-op Vital signs reviewed, Patient's Cardiovascular Status Stable and Respiratory Function Stable  Post-op Vital Signs: Reviewed  Filed Vitals:   10/18/14 1330  BP: 141/54  Pulse: 48  Temp:   Resp: 16    Complications: No apparent anesthesia complications

## 2014-10-18 NOTE — H&P (Signed)
H&P   ASTARIA NANEZ (MR# [id number])      H&P Info    Chief Strategy Officer Note Status Last Update User Last Update Date/Time   [name], MD Signed [name], MD 09/09/2014 11:11 AM    H&P    Expand All Collapse All   [name] 09/09/2014 10:44 AM Location: [facility] Patient #: [id number] DOB: 07-25-1945 Married / Language: [name] / Race: White Female History of Present Illness Sarah Moores A. Behr Cislo MD; 09/09/2014 11:10 AM) Patient words: reck gallstone pancreatitis Pt doing well without any severe episodes of abdominal pain since last visit. 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 No change in diet, nausea or vomiting. Some mild pressure from time to time.              Results for [name] (MRN [id number]) as of 09/09/2014 11:07 Ref. Range 03/13/2014 03:15 Sodium Latest Ref Range: 135-145 mmol/L 134 (L) Potassium Latest Ref Range: 3.5-5.1 mmol/L 3.8 Chloride Latest Ref Range: 96-112 mmol/L 104 CO2 Latest Ref Range: 19-32 mmol/L 26 BUN Latest Ref Range: 6-23 mg/dL 15 Creatinine Latest Ref Range: 0.50-1.10 mg/dL 0.83 Calcium Latest Ref Range: 8.4-10.5 mg/dL 8.0 (L) EGFR (Non-African Amer.) Latest Ref Range: >90 mL/min 70 (L) EGFR (African American) Latest Ref Range: >90 mL/min 82 (L) Glucose Latest Ref Range: 70-99 mg/dL 115 (H) Anion gap Latest Ref Range: 5-15 4 (L) Lipase Latest Ref Range: 11-59 U/L 111 (H) WBC Latest Ref Range: 4.0-10.5 K/uL 8.2 RBC Latest Ref Range: 3.87-5.11 MIL/uL 3.38 (L) Hemoglobin Latest Ref Range: 12.0-15.0 g/dL 10.3 (L) HCT Latest Ref Range: 36.0-46.0 % 30.9 (L) MCV Latest Ref Range: 78.0-100.0 fL 91.4 MCH Latest Ref Range: 26.0-34.0 pg 30.5 MCHC Latest Ref Range: 30.0-36.0 g/dL 33.3 RDW Latest Ref Range: 11.5-15.5 % 14.1 Platelets Latest Ref Range: 150-400 K/uL 136 (L) Prothrombin Time Latest Ref Range: 11.6-15.2 seconds 22.0 (H) INR Latest Ref Range: 0.00-1.49 1.90 (H)              CLINICAL  DATA: Recent CT scan demonstrating gallstones ; history of pancreatitis ; currently asymptomatic.  EXAM: ULTRASOUND ABDOMEN COMPLETE  COMPARISON: Abdominal pelvic CT scan of May 12, 2014  FINDINGS: Gallbladder: The gallbladder is adequately distended. There are multiple mobile echogenic shadowing stones. The largest measures 7 mm in diameter. There is no gallbladder wall thickening, pericholecystic fluid, or positive sonographic Murphy's sign.  Common bile duct: Diameter: 4.7-5.9 mm. No intraluminal stones are demonstrated.  Liver: The hepatic echotexture is heterogeneous. There is no focal mass nor ductal dilation.  IVC: No abnormality visualized.  Pancreas: Evaluation of the pancreas was limited by bowel gas.  Spleen: Size and appearance within normal limits.  Right Kidney: Length: 11.7 cm. Echogenicity within normal limits. No mass or hydronephrosis visualized.  Left Kidney: Length: 10.7 cm. Echogenicity within normal limits. No mass or hydronephrosis visualized.  Abdominal aorta: No aneurysm visualized.  Other findings: No ascites is demonstrated today.  IMPRESSION: 1. Gallstones without sonographic evidence of acute cholecystitis. 2. No acute abnormality is demonstrated elsewhere within the abdomen. Evaluation of the pancreas was limited.   Electronically Signed By: [name] On: 04/12/2014 09:34.  The patient is a 69 year old female   Allergies [name], [name]; 09/09/2014 10:45 AM) No Known Drug Allergies 04/29/2014  Medication History ([name], [name]; 09/09/2014 10:45 AM) OxyCODONE HCl (5MG  Tablet, Oral) Active. Acetaminophen-Codeine #3 (300-30MG  Tablet, Oral) Active. Atorvastatin Calcium (20MG  Tablet, Oral) Active. Clindamycin HCl (150MG  Capsule, Oral) Active. Diltiazem HCl ER  Coated Beads (180MG  Capsule ER 24HR, Oral) Active. Glimepiride (4MG  Tablet, Oral) Active. Levothyroxine Sodium (112MCG Tablet, Oral) Active. Lisinopril (20MG   Tablet, Oral) Active. MetFORMIN HCl (1000MG  Tablet, Oral) Active. Warfarin Sodium (5MG  Tablet, Oral) Active. Medications Reconciled    Vitals ([name] [name]; 09/09/2014 10:45 AM) 09/09/2014 10:44 AM Weight: 239 lb Height: 70in Body Surface Area: 2.31 m Body Mass Index: 34.29 kg/m Temp.: 97.36F(Temporal)  Pulse: 79 (Regular)  BP: 132/80 (Sitting, Left Arm, Standard)     Physical Exam ([name] MD; 09/09/2014 11:10 AM)  General Mental Status-Alert. General Appearance-Consistent with stated age. Hydration-Well hydrated. Voice-Normal.  Head and Neck Head-normocephalic, atraumatic with no lesions or palpable masses.  Eye Eyeball - Bilateral-Extraocular movements intact. Sclera/Conjunctiva - Bilateral-No scleral icterus.  Chest and Lung Exam Chest and lung exam reveals -quiet, even and easy respiratory effort with no use of accessory muscles and on auscultation, normal breath sounds, no adventitious sounds and normal vocal resonance. Inspection Chest Wall - Normal. Back - normal.  Cardiovascular Cardiovascular examination reveals -on palpation PMI is normal in location and amplitude, no palpable S3 or S4. Normal cardiac borders., normal heart sounds, regular rate and rhythm with no murmurs, carotid auscultation reveals no bruits and normal pedal pulses bilaterally.  Abdomen Inspection Inspection of the abdomen reveals - No Hernias. Skin - Scar - no surgical scars. Palpation/Percussion Palpation and Percussion of the abdomen reveal - Soft, Non Tender, No Rebound tenderness, No Rigidity (guarding) and No hepatosplenomegaly. Auscultation Auscultation of the abdomen reveals - Bowel sounds normal.  Neurologic Neurologic evaluation reveals -alert and oriented x 3 with no impairment of recent or remote memory. Mental Status-Normal.  Musculoskeletal Normal Exam - Left-Upper Extremity Strength Normal and Lower Extremity Strength  Normal. Normal Exam - Right-Upper Extremity Strength Normal, Lower Extremity Weakness.    Assessment & Plan (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 Risks of laparoscopic cholecystectomy include bleeding, infection, bile duct injury, leak, death, open surgery, diarrhea, other surgery, organ injury, blood vessel injury, DVT, and additional care.  Current Plans Pt Education - Laparoscopic cholecystectomy: discussed with patient and provided information. 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

## 2014-10-18 NOTE — Anesthesia Preprocedure Evaluation (Addendum)
Anesthesia Evaluation  Patient identified by MRN, date of birth, ID band Patient awake    Reviewed: Allergy & Precautions, H&P , NPO status , Patient's Chart, lab work & pertinent test results  History of Anesthesia Complications (+) PONV  Airway Mallampati: II  TM Distance: >3 FB Neck ROM: Full    Dental no notable dental hx. (+) Teeth Intact, Dental Advisory Given   Pulmonary    Pulmonary exam normal breath sounds clear to auscultation       Cardiovascular hypertension, Pt. on medications  Rhythm:Regular Rate:Normal  H/o PE   Neuro/Psych negative neurological ROS  negative psych ROS   GI/Hepatic negative GI ROS, Neg liver ROS,   Endo/Other  diabetes, Type 2, Oral Hypoglycemic AgentsHypothyroidism   Renal/GU negative Renal ROS  negative genitourinary   Musculoskeletal   Abdominal   Peds  Hematology negative hematology ROS (+)   Anesthesia Other Findings   Reproductive/Obstetrics negative OB ROS                            Anesthesia Physical Anesthesia Plan  ASA: III  Anesthesia Plan: General   Post-op Pain Management:    Induction: Intravenous  Airway Management Planned: Oral ETT  Additional Equipment:   Intra-op Plan:   Post-operative Plan: Extubation in OR  Informed Consent: I have reviewed the patients History and Physical, chart, labs and discussed the procedure including the risks, benefits and alternatives for the proposed anesthesia with the patient or authorized representative who has indicated his/her understanding and acceptance.   Dental advisory given  Plan Discussed with: CRNA  Anesthesia Plan Comments:         Anesthesia Quick Evaluation

## 2014-10-18 NOTE — Anesthesia Procedure Notes (Signed)
Procedure Name: Intubation Date/Time: 10/18/2014 11:15 AM Performed by: [place] Pink Pre-anesthesia Checklist: Patient identified, Emergency Drugs available, Suction available, Patient being monitored and Timeout performed Patient Re-evaluated:Patient Re-evaluated prior to inductionOxygen Delivery Method: Circle system utilized Preoxygenation: Pre-oxygenation with 100% oxygen Intubation Type: IV induction Ventilation: Mask ventilation without difficulty Laryngoscope Size: Mac and 4 Grade View: Grade I Tube type: Oral Tube size: 7.0 mm Number of attempts: 1 Airway Equipment and Method: Stylet and LTA kit utilized Placement Confirmation: ETT inserted through vocal cords under direct vision,  positive ETCO2 and breath sounds checked- equal and bilateral Secured at: 21 cm Tube secured with: Tape Dental Injury: Teeth and Oropharynx as per pre-operative assessment

## 2014-10-18 NOTE — Transfer of Care (Signed)
Immediate Anesthesia Transfer of Care Note  Patient: [name]  Procedure(s) Performed: Procedure(s): LAPAROSCOPIC CHOLECYSTECTOMY WITH INTRAOPERATIVE CHOLANGIOGRAM  Patient Location: PACU  Anesthesia Type:General  Level of Consciousness: awake, alert  and oriented  Airway & Oxygen Therapy: Patient Spontanous Breathing and Patient connected to nasal cannula oxygen  Post-op Assessment: Report given to RN and Post -op Vital signs reviewed and stable  Post vital signs: Reviewed and stable  Last Vitals:  Filed Vitals:   10/18/14 0855  BP: 126/52  Pulse: 65  Temp: 36.6 C  Resp: 20    Complications: No apparent anesthesia complications

## 2014-10-19 ENCOUNTER — Encounter (HOSPITAL_COMMUNITY): Payer: Self-pay | Admitting: Surgery

## 2015-06-24 IMAGING — US US ABDOMEN COMPLETE
1 series · 13 of 25 positions shown · non-contrast
Comparison: Abdominal pelvic CT scan May 12, 2014

CLINICAL DATA: Recent CT scan demonstrating gallstones ; history of
pancreatitis ; currently asymptomatic.

EXAM:
ULTRASOUND ABDOMEN COMPLETE

[Series 1: us abdomen complete · 0.24mm/px · 13 of 70 slices shown]
[im 1/70]
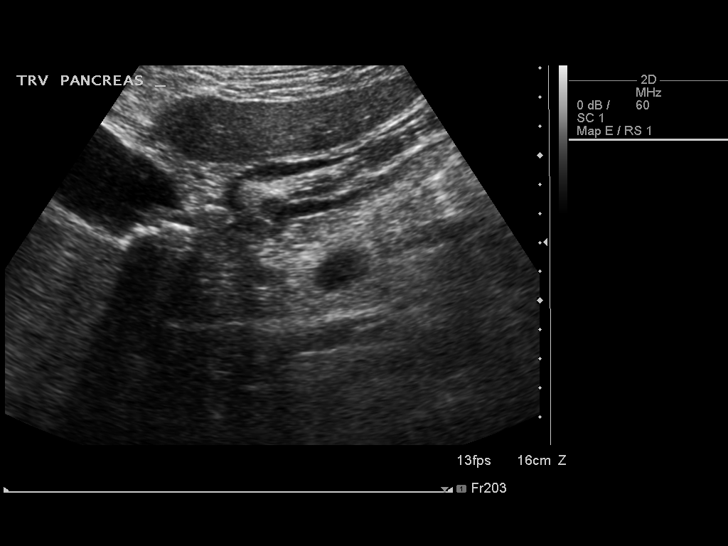
[im 6/70]
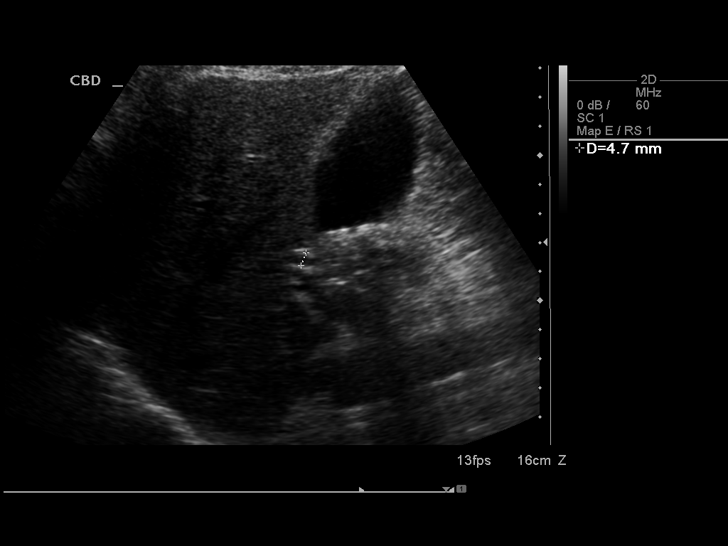
[im 12/70]
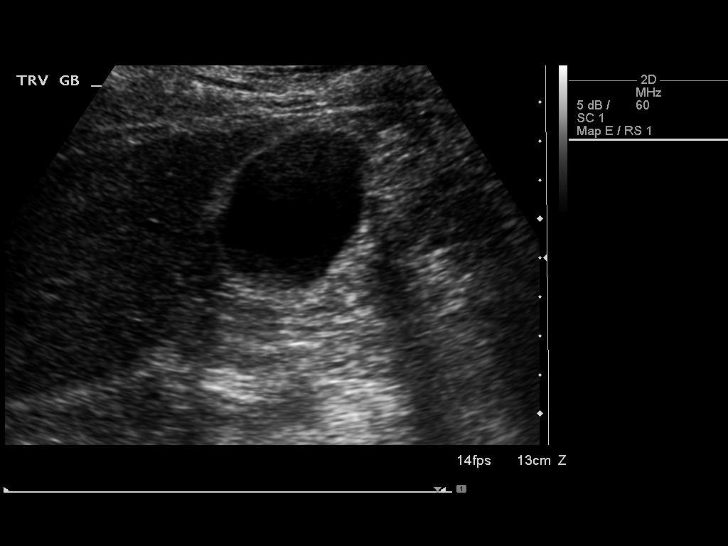
[im 18/70]
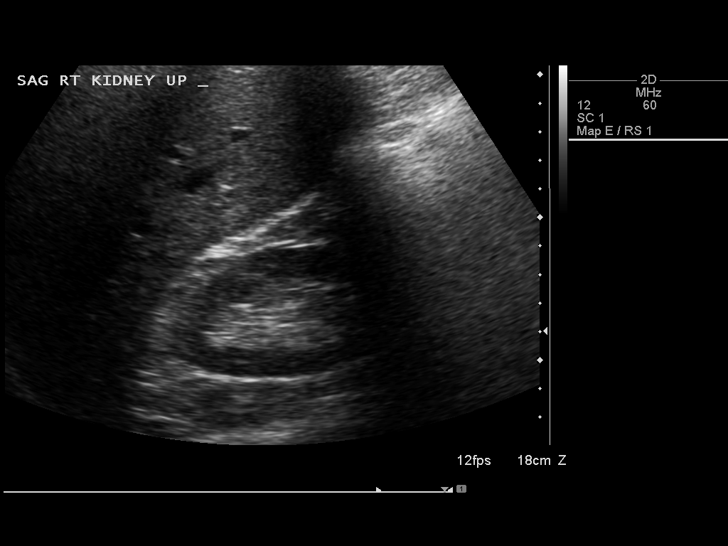
[im 24/70]
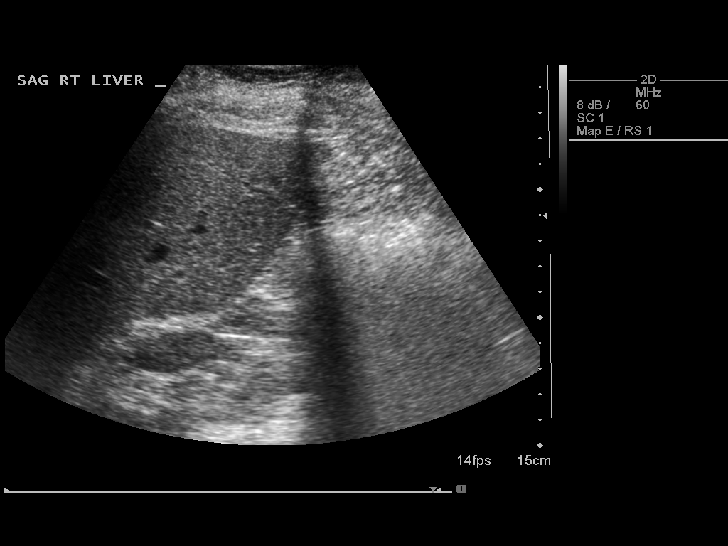
[im 29/70]
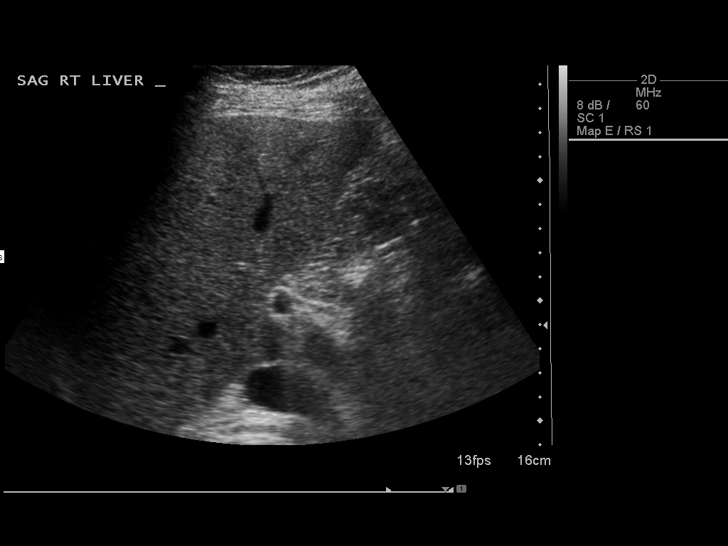
[im 35/70]
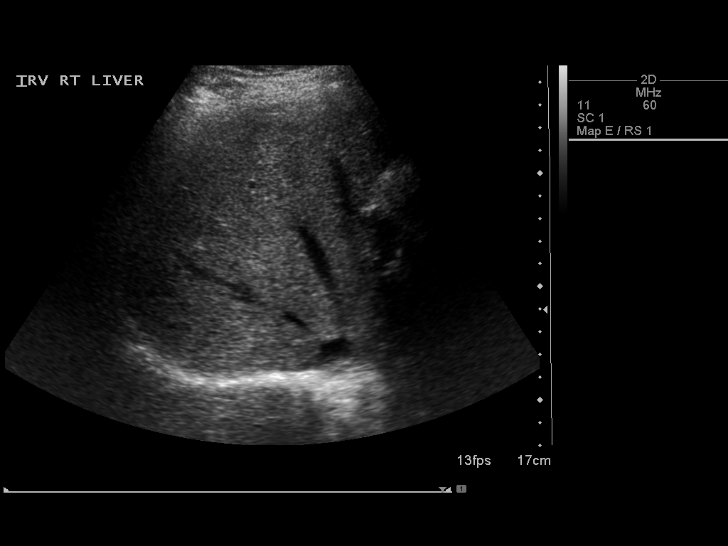
[im 41/70]
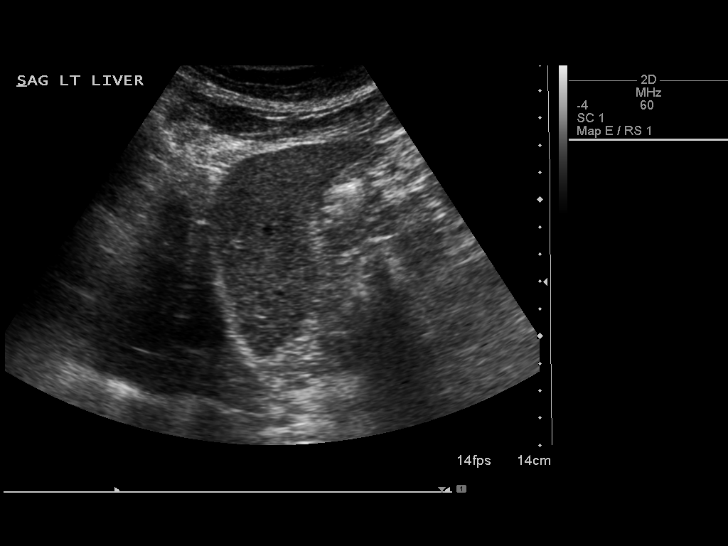
[im 47/70]
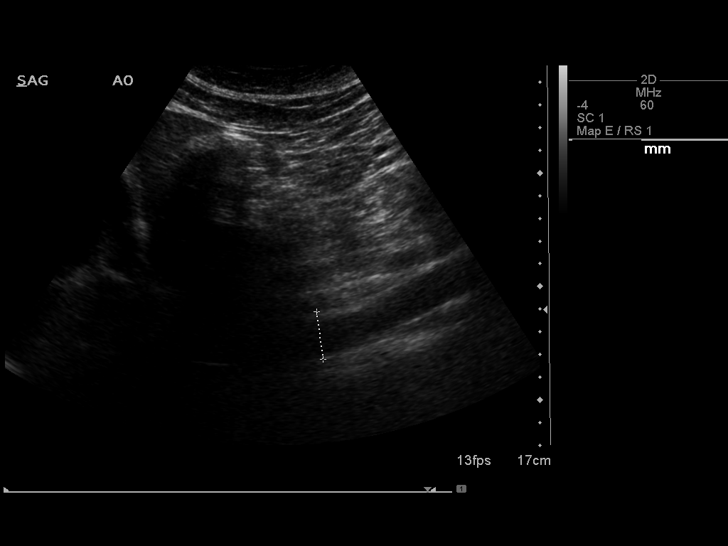
[im 52/70]
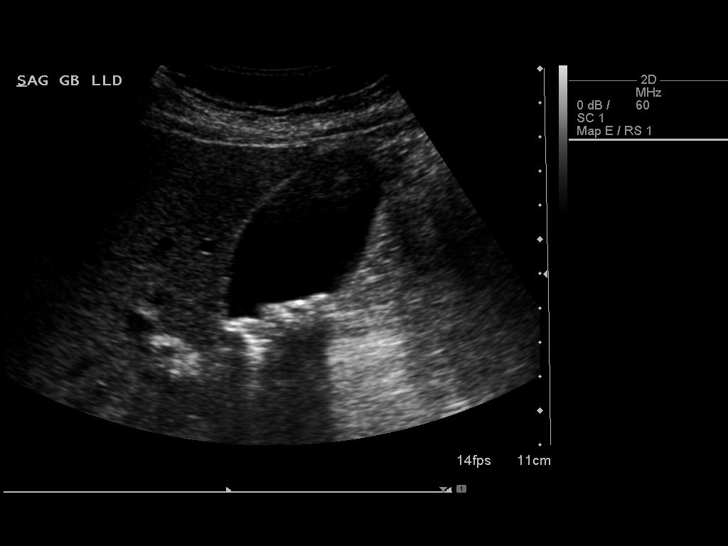
[im 58/70]
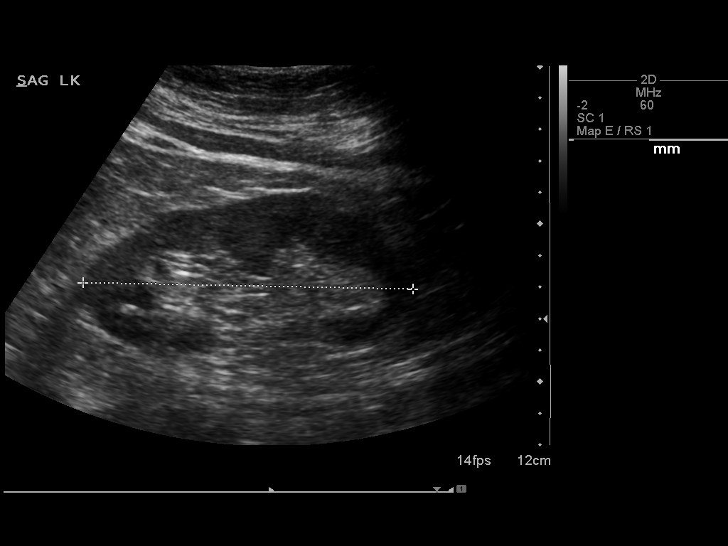
[im 64/70]
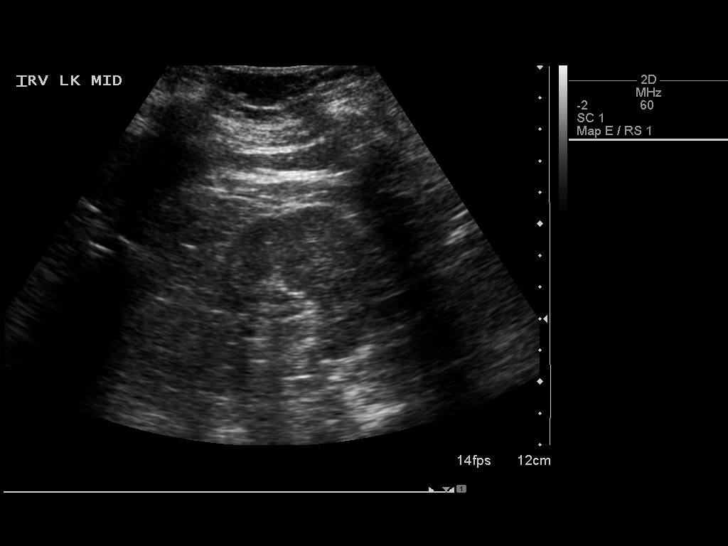
[im 70/70]
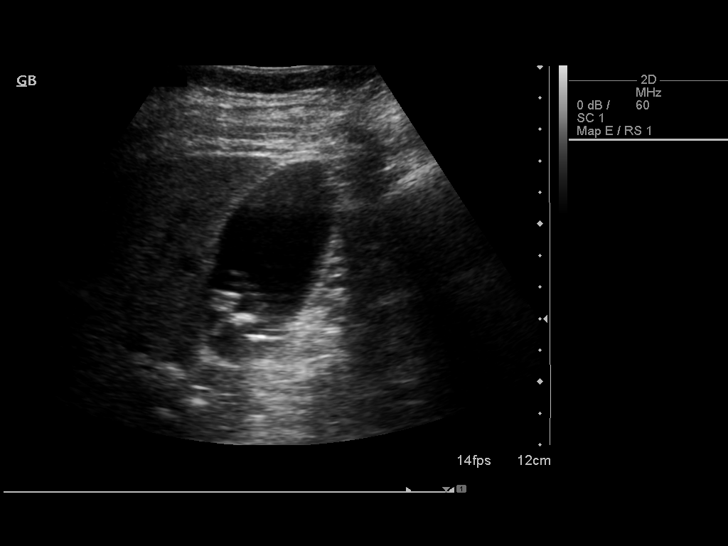

[13 of 25 positions shown; findings below may reference images not displayed]

FINDINGS: Gallbladder: The gallbladder is adequately distended. There are
multiple mobile echogenic shadowing stones. The largest measures 7
mm in diameter. There is no gallbladder wall thickening,
pericholecystic fluid, or positive sonographic Murphy's sign.

Common bile duct: Diameter: 4.7-5.9 mm. No intraluminal stones are
demonstrated.

Liver: The hepatic echotexture is heterogeneous. There is no focal
mass nor ductal dilation.

IVC: No abnormality visualized.

Pancreas: Evaluation of the pancreas was limited by bowel gas.

Spleen: Size and appearance within normal limits.

Right Kidney: Length: 11.7 cm. Echogenicity within normal limits. No
mass or hydronephrosis visualized.

Left Kidney: Length: 10.7 cm. Echogenicity within normal limits. No
mass or hydronephrosis visualized.

Abdominal aorta: No aneurysm visualized.

Other findings: No ascites is demonstrated today.
IMPRESSION: 1. Gallstones without sonographic evidence of acute cholecystitis.
2. No acute abnormality is demonstrated elsewhere within the
abdomen. Evaluation of the pancreas was limited.

## 2019-03-09 ENCOUNTER — Telehealth: Payer: Self-pay | Admitting: *Deleted

## 2019-03-09 NOTE — Telephone Encounter (Signed)
Pt called stating she is already received her COVID vaccine; she was left a message on her voicemail; she would like to be removed from the wai tlist; will remove per pt's request.5

## 2019-05-12 ENCOUNTER — Encounter: Payer: Self-pay | Admitting: Family Medicine

## 2019-05-12 DIAGNOSIS — H9193 Unspecified hearing loss, bilateral: Secondary | ICD-10-CM | POA: Diagnosis not present

## 2019-05-12 DIAGNOSIS — E559 Vitamin D deficiency, unspecified: Secondary | ICD-10-CM | POA: Diagnosis not present

## 2019-05-12 DIAGNOSIS — N183 Chronic kidney disease, stage 3 unspecified: Secondary | ICD-10-CM | POA: Diagnosis not present

## 2019-05-12 DIAGNOSIS — I2699 Other pulmonary embolism without acute cor pulmonale: Secondary | ICD-10-CM | POA: Diagnosis not present

## 2019-05-12 DIAGNOSIS — E782 Mixed hyperlipidemia: Secondary | ICD-10-CM | POA: Diagnosis not present

## 2019-05-12 DIAGNOSIS — I10 Essential (primary) hypertension: Secondary | ICD-10-CM | POA: Diagnosis not present

## 2019-05-12 DIAGNOSIS — E1121 Type 2 diabetes mellitus with diabetic nephropathy: Secondary | ICD-10-CM | POA: Diagnosis not present

## 2019-05-12 DIAGNOSIS — K219 Gastro-esophageal reflux disease without esophagitis: Secondary | ICD-10-CM | POA: Diagnosis not present

## 2019-05-17 DIAGNOSIS — E1121 Type 2 diabetes mellitus with diabetic nephropathy: Secondary | ICD-10-CM | POA: Diagnosis not present

## 2019-05-17 DIAGNOSIS — E673 Hypervitaminosis D: Secondary | ICD-10-CM | POA: Diagnosis not present

## 2019-05-17 DIAGNOSIS — Z7984 Long term (current) use of oral hypoglycemic drugs: Secondary | ICD-10-CM | POA: Diagnosis not present

## 2019-06-03 DIAGNOSIS — L821 Other seborrheic keratosis: Secondary | ICD-10-CM | POA: Diagnosis not present

## 2019-06-03 DIAGNOSIS — L814 Other melanin hyperpigmentation: Secondary | ICD-10-CM | POA: Diagnosis not present

## 2019-06-03 DIAGNOSIS — L237 Allergic contact dermatitis due to plants, except food: Secondary | ICD-10-CM | POA: Diagnosis not present

## 2019-06-03 DIAGNOSIS — Z85828 Personal history of other malignant neoplasm of skin: Secondary | ICD-10-CM | POA: Diagnosis not present

## 2019-06-03 DIAGNOSIS — D225 Melanocytic nevi of trunk: Secondary | ICD-10-CM | POA: Diagnosis not present

## 2019-06-03 DIAGNOSIS — Z86018 Personal history of other benign neoplasm: Secondary | ICD-10-CM | POA: Diagnosis not present

## 2019-06-03 DIAGNOSIS — D0372 Melanoma in situ of left lower limb, including hip: Secondary | ICD-10-CM | POA: Diagnosis not present

## 2019-06-03 DIAGNOSIS — L578 Other skin changes due to chronic exposure to nonionizing radiation: Secondary | ICD-10-CM | POA: Diagnosis not present

## 2019-06-03 DIAGNOSIS — D485 Neoplasm of uncertain behavior of skin: Secondary | ICD-10-CM | POA: Diagnosis not present

## 2019-07-07 ENCOUNTER — Encounter: Payer: Medicare PPO | Attending: Family Medicine | Admitting: Registered"

## 2019-07-07 ENCOUNTER — Encounter: Payer: Self-pay | Admitting: Registered"

## 2019-07-07 ENCOUNTER — Other Ambulatory Visit: Payer: Self-pay

## 2019-07-07 DIAGNOSIS — E1165 Type 2 diabetes mellitus with hyperglycemia: Secondary | ICD-10-CM | POA: Insufficient documentation

## 2019-07-07 NOTE — Patient Instructions (Addendum)
Consider increasing your physical activity with a goal 5x week, 30-45 min day. Use your kettle ball at least 3x week Silver sneakers / [employer] Look into groups to walk with such as [place] site.  Continue to eat vegetables daily Consider adding beans to more dishes Continue having whole grain bread Continue including berries When eating fruit consider having protein with it  Increase your water intake to help avoid having issues, such as dehydration from the Jardiance.  Spend some time studying the mindful eating chapter of your TOPS and if you like this information you could get the book that it is based on.

## 2019-07-07 NOTE — Progress Notes (Signed)
Diabetes Self-Management Education  Visit Type: First/Initial  Appt. Start Time: 1405 Appt. End Time: 1510  07/08/2019  [name], identified by name and date of birth, is a 74 y.o. female with a diagnosis of Diabetes: Type 2.   ASSESSMENT  There were no vitals taken for this visit. There is no height or weight on file to calculate BMI.   Pt states her MD was very concerned in Nov when her A1c was ~10% Pt states she has lost 15 lbs since then and thinks the Jardiance has helped. Because patient has dehydration in her PMH, RD emphasized the importance of drinking enough fluids while taking Jardiance.  Pt is taking the maximum oral agents for diabetes and has brought A1c down to 7.9%.   Patient states she knows what to do but has not put it all into place. Patient reports she has been a member of [employer] ([employer]) for 25 yrs. Pt brought in a very comprehensive TOPS book that includes exchange lists.  Pt states [place] and likes to [place]. As a child grew up with always having sweets around the house and states she doesn't bake a lot now but does like cookies. Patient reports another change she thinks she should do is work on drinking 2 glasses of milk each day.  Patient states her walking has decreased since her husband doesn't walk with her as much due to his CHF. Pt states she enjoys water aerobics and is looking into getting back into that. Pt reports she was not aware of the relationship with exercise and increasing insulin sensitivity. This information appears to help motivate patient to commit to increasing physical activity.   Diabetes Self-Management Education - 07/07/19 1418      Visit Information   Visit Type First/Initial      Initial Visit   Diabetes Type Type 2    Are you currently following a meal plan? No    Are you taking your medications as prescribed? Yes    Date Diagnosed Sept 2003      Health Coping   How would you rate your  overall health? Good      Psychosocial Assessment   Patient Belief/Attitude about Diabetes Motivated to manage diabetes    How often do you need to have someone help you when you read instructions, pamphlets, or other written materials from your doctor or pharmacy? 1 - Never    What is the last grade level you completed in school? BS in education      Complications   Last HgB A1C per patient/outside source 7.9 %   05/12/19 referal lab   Fasting Blood glucose range (mg/dL) 82-505;397-673   419-379 - usually 135 avg   Number of hypoglycemic episodes per month 0    Have you had a dilated eye exam in the past 12 months? Yes    Have you had a dental exam in the past 12 months? Yes    Are you checking your feet? Yes    How many days per week are you checking your feet? 7      Dietary Intake   Breakfast salad, hard boiled egg, cold cuts, 2 c coffee with 1% milk    Snack (morning) none    Lunch sandwich 1 slice of bread OR salad    Snack (afternoon) apple    Dinner vegetables, sometimes pasta, 1/2 sweet potato not too much beef    Snack (evening) none  Beverage(s) coffee, 2 bottles water      Exercise   Exercise Type Light (walking / raking leaves)    How many days per week to you exercise? 2    How many minutes per day do you exercise? 20    Total minutes per week of exercise 40      Patient Education   Previous Diabetes Education Yes (please comment)   5-6 yrs   Nutrition management  Role of diet in the treatment of diabetes and the relationship between the three main macronutrients and blood glucose level    Physical activity and exercise  Role of exercise on diabetes management, blood pressure control and cardiac health.    Medications Reviewed patients medication for diabetes, action, purpose, timing of dose and side effects.      Individualized Goals (developed by patient)   Nutrition General guidelines for healthy choices and portions discussed    Physical Activity Exercise 5-7  days per week    Reducing Risk Other (comment)   increase water intake     Outcomes   Expected Outcomes Demonstrated interest in learning. Expect positive outcomes    Future DMSE 4-6 wks    Program Status Not Completed           Individualized Plan for Diabetes Self-Management Training:   Learning Objective:  Patient will have a greater understanding of diabetes self-management. Patient education plan is to attend individual and/or group sessions per assessed needs and concerns.   Patient Instructions  Consider increasing your physical activity with a goal 5x week, 30-45 min day. Use your kettle ball at least 3x week Silver sneakers / [employer] Look into groups to walk with such as [place] site.  Continue to eat vegetables daily Consider adding beans to more dishes Continue having whole grain bread Continue including berries When eating fruit consider having protein with it  Increase your water intake to help avoid having issues, such as dehydration from the Jardiance.  Spend some time studying the mindful eating chapter of your TOPS and if you like this information you could get the book that it is based on.   Expected Outcomes:  Demonstrated interest in learning. Expect positive outcomes  Education material provided: none  If problems or questions, patient to contact team via:  Phone  Future DSME appointment: 4-6 wks

## 2019-07-08 DIAGNOSIS — L905 Scar conditions and fibrosis of skin: Secondary | ICD-10-CM | POA: Diagnosis not present

## 2019-07-08 DIAGNOSIS — Z0000 Encounter for general adult medical examination without abnormal findings: Secondary | ICD-10-CM | POA: Diagnosis not present

## 2019-07-08 DIAGNOSIS — D0372 Melanoma in situ of left lower limb, including hip: Secondary | ICD-10-CM | POA: Diagnosis not present

## 2019-07-08 DIAGNOSIS — Z7189 Other specified counseling: Secondary | ICD-10-CM | POA: Diagnosis not present

## 2019-07-08 DIAGNOSIS — Z1389 Encounter for screening for other disorder: Secondary | ICD-10-CM | POA: Diagnosis not present

## 2019-08-13 DIAGNOSIS — Z0000 Encounter for general adult medical examination without abnormal findings: Secondary | ICD-10-CM | POA: Diagnosis not present

## 2019-08-13 DIAGNOSIS — I10 Essential (primary) hypertension: Secondary | ICD-10-CM | POA: Diagnosis not present

## 2019-08-13 DIAGNOSIS — Z79899 Other long term (current) drug therapy: Secondary | ICD-10-CM | POA: Diagnosis not present

## 2019-08-13 DIAGNOSIS — E782 Mixed hyperlipidemia: Secondary | ICD-10-CM | POA: Diagnosis not present

## 2019-08-13 DIAGNOSIS — N183 Chronic kidney disease, stage 3 unspecified: Secondary | ICD-10-CM | POA: Diagnosis not present

## 2019-08-13 DIAGNOSIS — K219 Gastro-esophageal reflux disease without esophagitis: Secondary | ICD-10-CM | POA: Diagnosis not present

## 2019-08-13 DIAGNOSIS — E673 Hypervitaminosis D: Secondary | ICD-10-CM | POA: Diagnosis not present

## 2019-08-13 DIAGNOSIS — E039 Hypothyroidism, unspecified: Secondary | ICD-10-CM | POA: Diagnosis not present

## 2019-08-13 DIAGNOSIS — E1121 Type 2 diabetes mellitus with diabetic nephropathy: Secondary | ICD-10-CM | POA: Diagnosis not present

## 2019-08-16 ENCOUNTER — Encounter: Payer: Medicare PPO | Attending: Family Medicine | Admitting: Registered"

## 2019-08-16 ENCOUNTER — Other Ambulatory Visit: Payer: Self-pay

## 2019-08-16 DIAGNOSIS — E1165 Type 2 diabetes mellitus with hyperglycemia: Secondary | ICD-10-CM | POA: Diagnosis not present

## 2019-08-16 NOTE — Progress Notes (Signed)
Diabetes Self-Management Education  Visit Type:  Type 2 Diabetes  Appt. Start Time: 11:30 Appt. End Time: 12:00  08/17/2019  [name], identified by name and date of birth, is a 74 y.o. female with a diagnosis of Diabetes:  .   ASSESSMENT  There were no vitals taken for this visit. There is no height or weight on file to calculate BMI.  Pt reports that she had went in for blood work last week but has not received the results back yet. Pt reports FBS: 120-129; and continues to trend down since starting Jardiance in Jan.  From her goals last visit patient has increased her physical activity, started using kettle bell 2x week; 15 lbs x 25 reps. Her original goal was 3x/week. Pt states she has also started walking with the cooler weather. Pt likes to walk with her husband but d/t his afib he cannot walk far. Diet goals: Pt cooked beans and eat more often. Pt reports she had forgot the suggestion to pair protein with fruit and has not thought about that.  Pt also reports enjoying the different approach of mindfulness approach rather than following a structured meal plan. Pt states she read the book which was referenced in her TOPS manual. Pt states she resonated with the description of emotional eating and how to investigate reactions vs responses. Pt reports she would like to contribute to her [employer] group by giving a presentation.   Pt states she feels she has the tools she needs to continue making progress in her diabetes management and will schedule a follow-up late if desired.  Individualized Plan for Diabetes Self-Management Training:   Learning Objective:  Patient will have a greater understanding of diabetes self-management. Patient education plan is to attend individual and/or group sessions per assessed needs and concerns.    Patient Instructions  Consider calling the [employer] to see if they are doing pool time by reservations Interesting observations from reading Eat what you  love with Diabetes. Call if you would like to have a follow-up visit   Expected Outcomes:     Education material provided: none  If problems or questions, patient to contact team via:  Phone and Email  Future DSME appointment:  prn

## 2019-08-16 NOTE — Patient Instructions (Addendum)
Consider calling the [employer] to see if they are doing pool time by reservations Interesting observations from reading Eat what you love with Diabetes. Call if you would like to have a follow-up visit

## 2019-08-17 ENCOUNTER — Encounter: Payer: Self-pay | Admitting: Registered"

## 2019-08-19 DIAGNOSIS — N1831 Chronic kidney disease, stage 3a: Secondary | ICD-10-CM | POA: Diagnosis not present

## 2019-08-19 DIAGNOSIS — K219 Gastro-esophageal reflux disease without esophagitis: Secondary | ICD-10-CM | POA: Diagnosis not present

## 2019-08-19 DIAGNOSIS — E782 Mixed hyperlipidemia: Secondary | ICD-10-CM | POA: Diagnosis not present

## 2019-08-19 DIAGNOSIS — E673 Hypervitaminosis D: Secondary | ICD-10-CM | POA: Diagnosis not present

## 2019-08-19 DIAGNOSIS — I10 Essential (primary) hypertension: Secondary | ICD-10-CM | POA: Diagnosis not present

## 2019-08-19 DIAGNOSIS — E1121 Type 2 diabetes mellitus with diabetic nephropathy: Secondary | ICD-10-CM | POA: Diagnosis not present

## 2019-08-19 DIAGNOSIS — Z86711 Personal history of pulmonary embolism: Secondary | ICD-10-CM | POA: Diagnosis not present

## 2019-08-19 DIAGNOSIS — E039 Hypothyroidism, unspecified: Secondary | ICD-10-CM | POA: Diagnosis not present

## 2019-12-29 DIAGNOSIS — Z1231 Encounter for screening mammogram for malignant neoplasm of breast: Secondary | ICD-10-CM | POA: Diagnosis not present

## 2020-01-31 DIAGNOSIS — H43811 Vitreous degeneration, right eye: Secondary | ICD-10-CM | POA: Diagnosis not present

## 2020-01-31 DIAGNOSIS — H0102B Squamous blepharitis left eye, upper and lower eyelids: Secondary | ICD-10-CM | POA: Diagnosis not present

## 2020-01-31 DIAGNOSIS — Z961 Presence of intraocular lens: Secondary | ICD-10-CM | POA: Diagnosis not present

## 2020-01-31 DIAGNOSIS — H26492 Other secondary cataract, left eye: Secondary | ICD-10-CM | POA: Diagnosis not present

## 2020-01-31 DIAGNOSIS — H0102A Squamous blepharitis right eye, upper and lower eyelids: Secondary | ICD-10-CM | POA: Diagnosis not present

## 2020-01-31 DIAGNOSIS — H40013 Open angle with borderline findings, low risk, bilateral: Secondary | ICD-10-CM | POA: Diagnosis not present

## 2020-01-31 DIAGNOSIS — E119 Type 2 diabetes mellitus without complications: Secondary | ICD-10-CM | POA: Diagnosis not present

## 2020-01-31 DIAGNOSIS — H53021 Refractive amblyopia, right eye: Secondary | ICD-10-CM | POA: Diagnosis not present

## 2020-01-31 DIAGNOSIS — H1045 Other chronic allergic conjunctivitis: Secondary | ICD-10-CM | POA: Diagnosis not present

## 2020-02-17 DIAGNOSIS — E039 Hypothyroidism, unspecified: Secondary | ICD-10-CM | POA: Diagnosis not present

## 2020-02-17 DIAGNOSIS — I10 Essential (primary) hypertension: Secondary | ICD-10-CM | POA: Diagnosis not present

## 2020-02-17 DIAGNOSIS — E782 Mixed hyperlipidemia: Secondary | ICD-10-CM | POA: Diagnosis not present

## 2020-02-17 DIAGNOSIS — E1121 Type 2 diabetes mellitus with diabetic nephropathy: Secondary | ICD-10-CM | POA: Diagnosis not present

## 2020-02-17 DIAGNOSIS — N1831 Chronic kidney disease, stage 3a: Secondary | ICD-10-CM | POA: Diagnosis not present

## 2020-02-17 DIAGNOSIS — K219 Gastro-esophageal reflux disease without esophagitis: Secondary | ICD-10-CM | POA: Diagnosis not present

## 2020-02-17 DIAGNOSIS — Z86711 Personal history of pulmonary embolism: Secondary | ICD-10-CM | POA: Diagnosis not present

## 2020-02-17 DIAGNOSIS — E673 Hypervitaminosis D: Secondary | ICD-10-CM | POA: Diagnosis not present

## 2020-02-22 ENCOUNTER — Other Ambulatory Visit: Payer: Self-pay | Admitting: Family Medicine

## 2020-02-22 DIAGNOSIS — K219 Gastro-esophageal reflux disease without esophagitis: Secondary | ICD-10-CM | POA: Diagnosis not present

## 2020-02-22 DIAGNOSIS — I10 Essential (primary) hypertension: Secondary | ICD-10-CM | POA: Diagnosis not present

## 2020-02-22 DIAGNOSIS — N1831 Chronic kidney disease, stage 3a: Secondary | ICD-10-CM | POA: Diagnosis not present

## 2020-02-22 DIAGNOSIS — E1121 Type 2 diabetes mellitus with diabetic nephropathy: Secondary | ICD-10-CM | POA: Diagnosis not present

## 2020-02-22 DIAGNOSIS — E782 Mixed hyperlipidemia: Secondary | ICD-10-CM | POA: Diagnosis not present

## 2020-02-22 DIAGNOSIS — E041 Nontoxic single thyroid nodule: Secondary | ICD-10-CM | POA: Diagnosis not present

## 2020-02-22 DIAGNOSIS — E559 Vitamin D deficiency, unspecified: Secondary | ICD-10-CM | POA: Diagnosis not present

## 2020-02-22 DIAGNOSIS — Z86711 Personal history of pulmonary embolism: Secondary | ICD-10-CM | POA: Diagnosis not present

## 2020-02-22 DIAGNOSIS — E039 Hypothyroidism, unspecified: Secondary | ICD-10-CM | POA: Diagnosis not present

## 2020-03-06 ENCOUNTER — Ambulatory Visit
Admission: RE | Admit: 2020-03-06 | Discharge: 2020-03-06 | Disposition: A | Discharge status: Home / Self Care | Payer: Medicare PPO | Source: Ambulatory Visit | Attending: Family Medicine | Admitting: Family Medicine

## 2020-03-06 DIAGNOSIS — E041 Nontoxic single thyroid nodule: Secondary | ICD-10-CM

## 2020-03-06 DIAGNOSIS — E049 Nontoxic goiter, unspecified: Secondary | ICD-10-CM | POA: Diagnosis not present

## 2020-06-06 DIAGNOSIS — L814 Other melanin hyperpigmentation: Secondary | ICD-10-CM | POA: Diagnosis not present

## 2020-06-06 DIAGNOSIS — Z86006 Personal history of melanoma in-situ: Secondary | ICD-10-CM | POA: Diagnosis not present

## 2020-06-06 DIAGNOSIS — D225 Melanocytic nevi of trunk: Secondary | ICD-10-CM | POA: Diagnosis not present

## 2020-06-06 DIAGNOSIS — Z86018 Personal history of other benign neoplasm: Secondary | ICD-10-CM | POA: Diagnosis not present

## 2020-06-06 DIAGNOSIS — Z85828 Personal history of other malignant neoplasm of skin: Secondary | ICD-10-CM | POA: Diagnosis not present

## 2020-06-06 DIAGNOSIS — D224 Melanocytic nevi of scalp and neck: Secondary | ICD-10-CM | POA: Diagnosis not present

## 2020-06-06 DIAGNOSIS — L821 Other seborrheic keratosis: Secondary | ICD-10-CM | POA: Diagnosis not present

## 2020-06-06 DIAGNOSIS — L570 Actinic keratosis: Secondary | ICD-10-CM | POA: Diagnosis not present

## 2020-06-06 DIAGNOSIS — L578 Other skin changes due to chronic exposure to nonionizing radiation: Secondary | ICD-10-CM | POA: Diagnosis not present

## 2020-06-06 DIAGNOSIS — D485 Neoplasm of uncertain behavior of skin: Secondary | ICD-10-CM | POA: Diagnosis not present

## 2020-07-31 DIAGNOSIS — Z1389 Encounter for screening for other disorder: Secondary | ICD-10-CM | POA: Diagnosis not present

## 2020-07-31 DIAGNOSIS — Z0000 Encounter for general adult medical examination without abnormal findings: Secondary | ICD-10-CM | POA: Diagnosis not present

## 2020-08-18 DIAGNOSIS — N1831 Chronic kidney disease, stage 3a: Secondary | ICD-10-CM | POA: Diagnosis not present

## 2020-08-18 DIAGNOSIS — E1121 Type 2 diabetes mellitus with diabetic nephropathy: Secondary | ICD-10-CM | POA: Diagnosis not present

## 2020-08-18 DIAGNOSIS — Z7984 Long term (current) use of oral hypoglycemic drugs: Secondary | ICD-10-CM | POA: Diagnosis not present

## 2020-08-18 DIAGNOSIS — K219 Gastro-esophageal reflux disease without esophagitis: Secondary | ICD-10-CM | POA: Diagnosis not present

## 2020-08-18 DIAGNOSIS — E782 Mixed hyperlipidemia: Secondary | ICD-10-CM | POA: Diagnosis not present

## 2020-08-18 DIAGNOSIS — Z86711 Personal history of pulmonary embolism: Secondary | ICD-10-CM | POA: Diagnosis not present

## 2020-08-18 DIAGNOSIS — E041 Nontoxic single thyroid nodule: Secondary | ICD-10-CM | POA: Diagnosis not present

## 2020-08-18 DIAGNOSIS — E559 Vitamin D deficiency, unspecified: Secondary | ICD-10-CM | POA: Diagnosis not present

## 2020-08-18 DIAGNOSIS — E039 Hypothyroidism, unspecified: Secondary | ICD-10-CM | POA: Diagnosis not present

## 2020-08-18 DIAGNOSIS — I10 Essential (primary) hypertension: Secondary | ICD-10-CM | POA: Diagnosis not present

## 2020-08-28 DIAGNOSIS — E782 Mixed hyperlipidemia: Secondary | ICD-10-CM | POA: Diagnosis not present

## 2020-08-28 DIAGNOSIS — K219 Gastro-esophageal reflux disease without esophagitis: Secondary | ICD-10-CM | POA: Diagnosis not present

## 2020-08-28 DIAGNOSIS — Z9189 Other specified personal risk factors, not elsewhere classified: Secondary | ICD-10-CM | POA: Diagnosis not present

## 2020-08-28 DIAGNOSIS — N1831 Chronic kidney disease, stage 3a: Secondary | ICD-10-CM | POA: Diagnosis not present

## 2020-08-28 DIAGNOSIS — Z86711 Personal history of pulmonary embolism: Secondary | ICD-10-CM | POA: Diagnosis not present

## 2020-08-28 DIAGNOSIS — I10 Essential (primary) hypertension: Secondary | ICD-10-CM | POA: Diagnosis not present

## 2020-08-28 DIAGNOSIS — E039 Hypothyroidism, unspecified: Secondary | ICD-10-CM | POA: Diagnosis not present

## 2020-08-28 DIAGNOSIS — E1121 Type 2 diabetes mellitus with diabetic nephropathy: Secondary | ICD-10-CM | POA: Diagnosis not present

## 2020-08-28 DIAGNOSIS — E673 Hypervitaminosis D: Secondary | ICD-10-CM | POA: Diagnosis not present

## 2020-10-24 NOTE — Progress Notes (Signed)
Sarah Bjork, MD Reason for referral-hypertension and hyperlipidemia  HPI: 75 year old female for evaluation of hypertension and hyperlipidemia at request of Gweneth Dimitri, MD.  Patient had pulmonary embolus March 2016.  Echocardiogram October 2016 showed normal LV function, grade 1 diastolic dysfunction.  Laboratories August 2022 showed BUN 27, creatinine 1.2, sodium 138, potassium 4.7, total cholesterol 122 with LDL 65.  Patient has dyspnea with more vigorous activities but not routine activities.  No orthopnea, PND, chest pain or syncope.  Occasional minimal pedal edema.  Cardiology now asked to evaluate.  Current Outpatient Medications  Medication Sig Dispense Refill   atorvastatin (LIPITOR) 20 MG tablet Take 20 mg by mouth daily.     calcium citrate-vitamin D (CITRACAL+D) 315-200 MG-UNIT tablet Take 1 tablet by mouth daily.      diltiazem (CARDIZEM CD) 180 MG 24 hr capsule Take 180 mg by mouth daily.      empagliflozin (JARDIANCE) 25 MG TABS tablet Take by mouth daily.     glipiZIDE (GLUCOTROL) 10 MG tablet Take 10 mg by mouth 2 (two) times daily before a meal.      levothyroxine (SYNTHROID, LEVOTHROID) 100 MCG tablet Take 100 mcg by mouth daily before breakfast.     lisinopril (PRINIVIL,ZESTRIL) 20 MG tablet Take 20 mg by mouth daily.     metFORMIN (GLUCOPHAGE) 1000 MG tablet Take 1,000 mg by mouth 2 (two) times daily with a meal.     omeprazole (PRILOSEC) 20 MG capsule Take 10 mg by mouth as needed.     rivaroxaban (XARELTO) 20 MG TABS tablet Take 20 mg by mouth daily with supper.     aspirin EC 81 MG tablet Take 81 mg by mouth daily.     oxyCODONE-acetaminophen (ROXICET) 5-325 MG tablet Take 1-2 tablets by mouth every 4 (four) hours as needed. 30 tablet 0   No current facility-administered medications for this visit.    No Known Allergies   Past Medical History:  Diagnosis Date   Diabetes (HCC)    Type 2   Gallstones 03/07/2014   HLD (hyperlipidemia)     Hypertension    Hypothyroid    PE (pulmonary embolism) 8 2010   Pneumonia    PONV (postoperative nausea and vomiting)    nausea   Pregnancy     Past Surgical History:  Procedure Laterality Date   CHOLECYSTECTOMY  10/18/2014   Procedure: LAPAROSCOPIC CHOLECYSTECTOMY WITH INTRAOPERATIVE CHOLANGIOGRAM;  Surgeon: Harriette Bouillon, MD;  Location: [facility] OR;  Service: General;;   COLONOSCOPY W/ POLYPECTOMY     SKIN CANCER EXCISION     nose   TUMOR EXCISION Right    Fatty tumor removed from right breast   TUMOR REMOVAL Right    cheek    Social History   Socioeconomic History   Marital status: Widowed    Spouse name: Not on file   Number of children: 4   Years of education: Not on file   Highest education level: Not on file  Occupational History   Not on file  Tobacco Use   Smoking status: Never   Smokeless tobacco: Not on file  Substance and Sexual Activity   Alcohol use: No    Alcohol/week: 0.0 standard drinks   Drug use: No   Sexual activity: Not on file  Other Topics Concern   Not on file  Social History Narrative   Not on file   Social Determinants of Health   Financial Resource Strain: Not on file  Food Insecurity: Not on  file  Transportation Needs: Not on file  Physical Activity: Not on file  Stress: Not on file  Social Connections: Not on file  Intimate Partner Violence: Not on file    Family History  Problem Relation Age of Onset   Heart failure Mother    Heart failure Father     ROS: no fevers or chills, productive cough, hemoptysis, dysphasia, odynophagia, melena, hematochezia, dysuria, hematuria, rash, seizure activity, orthopnea, PND, claudication. Remaining systems are negative.  Physical Exam:   Blood pressure 140/60, pulse 74, height 5\' 9"  (1.753 m), weight 221 lb 6.4 oz (100.4 kg), SpO2 96 %.  General:  Well developed/well nourished in NAD Skin warm/dry Patient not depressed No peripheral clubbing Back-normal HEENT-normal/normal  eyelids Neck supple/normal carotid upstroke bilaterally; no bruits; no JVD; no thyromegaly chest - CTA/ normal expansion CV - RRR/normal S1 and S2; no murmurs, rubs or gallops;  PMI nondisplaced Abdomen -NT/ND, no HSM, no mass, + bowel sounds, no bruit 2+ femoral pulses, no bruits Ext-no edema, chords, 2+ DP Neuro-grossly nonfocal  ECG -normal sinus rhythm at a rate of 74, left axis deviation, IVCD.  Personally reviewed  A/P  1 hypertension-patient's blood pressure is borderline.  I have asked her to follow this at home and we will advance medications as needed.  2 hyperlipidemia-continue statin.    3 history of pulmonary embolus-continue Xarelto as she has had multiple pulmonary emboli previously.  Discontinue aspirin.  4 dyspnea on exertion.  We will arrange a calcium score and if elevated will need to advance statin.  , MD

## 2020-10-30 ENCOUNTER — Ambulatory Visit: Payer: Medicare PPO | Admitting: Cardiology

## 2020-10-30 ENCOUNTER — Encounter: Payer: Self-pay | Admitting: Cardiology

## 2020-10-30 ENCOUNTER — Other Ambulatory Visit: Payer: Self-pay

## 2020-10-30 VITALS — BP 140/60 | HR 74 | Ht 69.0 in | Wt 221.4 lb

## 2020-10-30 DIAGNOSIS — E7800 Pure hypercholesterolemia, unspecified: Secondary | ICD-10-CM | POA: Diagnosis not present

## 2020-10-30 DIAGNOSIS — R0609 Other forms of dyspnea: Secondary | ICD-10-CM | POA: Diagnosis not present

## 2020-10-30 DIAGNOSIS — I10 Essential (primary) hypertension: Secondary | ICD-10-CM | POA: Diagnosis not present

## 2020-10-30 DIAGNOSIS — Z136 Encounter for screening for cardiovascular disorders: Secondary | ICD-10-CM | POA: Diagnosis not present

## 2020-10-30 NOTE — Patient Instructions (Signed)
Medication Instructions:   STOP ASPIRIN  *If you need a refill on your cardiac medications before your next appointment, please call your pharmacy   Testing/Procedures:  CORONARY CALCIUM SCORING CT SCAN AT [address]   Follow-Up: At [facility], you and your health needs are our priority.  As part of our continuing mission to provide you with exceptional heart care, we have created designated Provider Care Teams.  These Care Teams include your primary Cardiologist (physician) and Advanced Practice Providers (APPs -  Physician Assistants and Nurse Practitioners) who all work together to provide you with the care you need, when you need it.  We recommend signing up for the patient portal called "MyChart".  Sign up information is provided on this After Visit Summary.  MyChart is used to connect with patients for Virtual Visits (Telemedicine).  Patients are able to view lab/test results, encounter notes, upcoming appointments, etc.  Non-urgent messages can be sent to your provider as well.   To learn more about what you can do with MyChart, go to [email or web address].    Your next appointment:   12 month(s)  The format for your next appointment:   In Person  Provider:   Olga Millers, MD

## 2020-11-20 ENCOUNTER — Other Ambulatory Visit: Payer: Self-pay

## 2020-11-20 ENCOUNTER — Ambulatory Visit (INDEPENDENT_AMBULATORY_CARE_PROVIDER_SITE_OTHER)
Admission: RE | Admit: 2020-11-20 | Discharge: 2020-11-20 | Disposition: A | Discharge status: Home / Self Care | Payer: Self-pay | Source: Ambulatory Visit | Attending: Cardiology | Admitting: Cardiology

## 2020-11-20 DIAGNOSIS — I10 Essential (primary) hypertension: Secondary | ICD-10-CM

## 2020-11-20 DIAGNOSIS — Z136 Encounter for screening for cardiovascular disorders: Secondary | ICD-10-CM

## 2020-11-20 DIAGNOSIS — E7800 Pure hypercholesterolemia, unspecified: Secondary | ICD-10-CM

## 2020-11-23 DIAGNOSIS — E1121 Type 2 diabetes mellitus with diabetic nephropathy: Secondary | ICD-10-CM | POA: Diagnosis not present

## 2020-11-23 DIAGNOSIS — N1831 Chronic kidney disease, stage 3a: Secondary | ICD-10-CM | POA: Diagnosis not present

## 2020-11-23 DIAGNOSIS — Z7984 Long term (current) use of oral hypoglycemic drugs: Secondary | ICD-10-CM | POA: Diagnosis not present

## 2020-12-19 DIAGNOSIS — H903 Sensorineural hearing loss, bilateral: Secondary | ICD-10-CM | POA: Diagnosis not present

## 2021-01-03 DIAGNOSIS — Z1231 Encounter for screening mammogram for malignant neoplasm of breast: Secondary | ICD-10-CM | POA: Diagnosis not present

## 2021-01-31 DIAGNOSIS — H43811 Vitreous degeneration, right eye: Secondary | ICD-10-CM | POA: Diagnosis not present

## 2021-01-31 DIAGNOSIS — Z961 Presence of intraocular lens: Secondary | ICD-10-CM | POA: Diagnosis not present

## 2021-01-31 DIAGNOSIS — H40013 Open angle with borderline findings, low risk, bilateral: Secondary | ICD-10-CM | POA: Diagnosis not present

## 2021-01-31 DIAGNOSIS — H53021 Refractive amblyopia, right eye: Secondary | ICD-10-CM | POA: Diagnosis not present

## 2021-01-31 DIAGNOSIS — H0102A Squamous blepharitis right eye, upper and lower eyelids: Secondary | ICD-10-CM | POA: Diagnosis not present

## 2021-01-31 DIAGNOSIS — H0102B Squamous blepharitis left eye, upper and lower eyelids: Secondary | ICD-10-CM | POA: Diagnosis not present

## 2021-01-31 DIAGNOSIS — E119 Type 2 diabetes mellitus without complications: Secondary | ICD-10-CM | POA: Diagnosis not present

## 2021-01-31 DIAGNOSIS — H26492 Other secondary cataract, left eye: Secondary | ICD-10-CM | POA: Diagnosis not present

## 2021-01-31 DIAGNOSIS — H1045 Other chronic allergic conjunctivitis: Secondary | ICD-10-CM | POA: Diagnosis not present

## 2021-02-05 DIAGNOSIS — H26492 Other secondary cataract, left eye: Secondary | ICD-10-CM | POA: Diagnosis not present

## 2021-02-22 DIAGNOSIS — E1165 Type 2 diabetes mellitus with hyperglycemia: Secondary | ICD-10-CM | POA: Diagnosis not present

## 2021-02-22 DIAGNOSIS — I10 Essential (primary) hypertension: Secondary | ICD-10-CM | POA: Diagnosis not present

## 2021-02-22 DIAGNOSIS — Z7984 Long term (current) use of oral hypoglycemic drugs: Secondary | ICD-10-CM | POA: Diagnosis not present

## 2021-02-22 DIAGNOSIS — E039 Hypothyroidism, unspecified: Secondary | ICD-10-CM | POA: Diagnosis not present

## 2021-02-22 DIAGNOSIS — E1121 Type 2 diabetes mellitus with diabetic nephropathy: Secondary | ICD-10-CM | POA: Diagnosis not present

## 2021-03-01 DIAGNOSIS — E782 Mixed hyperlipidemia: Secondary | ICD-10-CM | POA: Diagnosis not present

## 2021-03-01 DIAGNOSIS — I10 Essential (primary) hypertension: Secondary | ICD-10-CM | POA: Diagnosis not present

## 2021-03-01 DIAGNOSIS — K219 Gastro-esophageal reflux disease without esophagitis: Secondary | ICD-10-CM | POA: Diagnosis not present

## 2021-03-01 DIAGNOSIS — E1121 Type 2 diabetes mellitus with diabetic nephropathy: Secondary | ICD-10-CM | POA: Diagnosis not present

## 2021-03-01 DIAGNOSIS — E559 Vitamin D deficiency, unspecified: Secondary | ICD-10-CM | POA: Diagnosis not present

## 2021-03-01 DIAGNOSIS — E039 Hypothyroidism, unspecified: Secondary | ICD-10-CM | POA: Diagnosis not present

## 2021-03-01 DIAGNOSIS — H9193 Unspecified hearing loss, bilateral: Secondary | ICD-10-CM | POA: Diagnosis not present

## 2021-03-01 DIAGNOSIS — Z23 Encounter for immunization: Secondary | ICD-10-CM | POA: Diagnosis not present

## 2021-04-10 ENCOUNTER — Emergency Department (HOSPITAL_COMMUNITY): Payer: Medicare PPO

## 2021-04-10 ENCOUNTER — Other Ambulatory Visit: Payer: Self-pay

## 2021-04-10 ENCOUNTER — Encounter (HOSPITAL_COMMUNITY): Payer: Self-pay

## 2021-04-10 ENCOUNTER — Emergency Department (HOSPITAL_COMMUNITY)
Admission: EM | Admit: 2021-04-10 | Discharge: 2021-04-10 | Disposition: A | Discharge status: Home / Self Care | Payer: Medicare PPO | Attending: Emergency Medicine | Admitting: Emergency Medicine

## 2021-04-10 DIAGNOSIS — I959 Hypotension, unspecified: Secondary | ICD-10-CM | POA: Insufficient documentation

## 2021-04-10 DIAGNOSIS — R7989 Other specified abnormal findings of blood chemistry: Secondary | ICD-10-CM | POA: Insufficient documentation

## 2021-04-10 DIAGNOSIS — R109 Unspecified abdominal pain: Secondary | ICD-10-CM | POA: Diagnosis not present

## 2021-04-10 DIAGNOSIS — I10 Essential (primary) hypertension: Secondary | ICD-10-CM | POA: Insufficient documentation

## 2021-04-10 DIAGNOSIS — D72829 Elevated white blood cell count, unspecified: Secondary | ICD-10-CM | POA: Insufficient documentation

## 2021-04-10 DIAGNOSIS — Z7901 Long term (current) use of anticoagulants: Secondary | ICD-10-CM | POA: Insufficient documentation

## 2021-04-10 DIAGNOSIS — R55 Syncope and collapse: Secondary | ICD-10-CM | POA: Diagnosis not present

## 2021-04-10 DIAGNOSIS — J189 Pneumonia, unspecified organism: Secondary | ICD-10-CM | POA: Diagnosis not present

## 2021-04-10 DIAGNOSIS — R000 Tachycardia, unspecified: Secondary | ICD-10-CM | POA: Insufficient documentation

## 2021-04-10 DIAGNOSIS — Z7984 Long term (current) use of oral hypoglycemic drugs: Secondary | ICD-10-CM | POA: Diagnosis not present

## 2021-04-10 DIAGNOSIS — K602 Anal fissure, unspecified: Secondary | ICD-10-CM

## 2021-04-10 DIAGNOSIS — R410 Disorientation, unspecified: Secondary | ICD-10-CM | POA: Diagnosis not present

## 2021-04-10 DIAGNOSIS — Z79899 Other long term (current) drug therapy: Secondary | ICD-10-CM | POA: Diagnosis not present

## 2021-04-10 DIAGNOSIS — R42 Dizziness and giddiness: Secondary | ICD-10-CM | POA: Diagnosis present

## 2021-04-10 DIAGNOSIS — R1084 Generalized abdominal pain: Secondary | ICD-10-CM | POA: Insufficient documentation

## 2021-04-10 DIAGNOSIS — R739 Hyperglycemia, unspecified: Secondary | ICD-10-CM | POA: Diagnosis not present

## 2021-04-10 DIAGNOSIS — I213 ST elevation (STEMI) myocardial infarction of unspecified site: Secondary | ICD-10-CM | POA: Diagnosis not present

## 2021-04-10 LAB — CBG MONITORING, ED: Glucose-Capillary: 296 mg/dL — ABNORMAL HIGH (ref 70–99)

## 2021-04-10 LAB — HEPATIC FUNCTION PANEL
ALT: 16 U/L (ref 0–44)
AST: 26 U/L (ref 15–41)
Albumin: 3.9 g/dL (ref 3.5–5.0)
Alkaline Phosphatase: 61 U/L (ref 38–126)
Bilirubin, Direct: 0.2 mg/dL (ref 0.0–0.2)
Indirect Bilirubin: 0.5 mg/dL (ref 0.3–0.9)
Total Bilirubin: 0.7 mg/dL (ref 0.3–1.2)
Total Protein: 6.5 g/dL (ref 6.5–8.1)

## 2021-04-10 LAB — LACTIC ACID, PLASMA: Lactic Acid, Venous: 1.5 mmol/L (ref 0.5–1.9)

## 2021-04-10 LAB — BASIC METABOLIC PANEL
Anion gap: 13 (ref 5–15)
BUN: 24 mg/dL — ABNORMAL HIGH (ref 8–23)
CO2: 23 mmol/L (ref 22–32)
Calcium: 9.5 mg/dL (ref 8.9–10.3)
Chloride: 101 mmol/L (ref 98–111)
Creatinine, Ser: 1.6 mg/dL — ABNORMAL HIGH (ref 0.44–1.00)
GFR, Estimated: 33 mL/min — ABNORMAL LOW (ref >60)
Glucose, Bld: 303 mg/dL — ABNORMAL HIGH (ref 70–99)
Potassium: 3.8 mmol/L (ref 3.5–5.1)
Sodium: 137 mmol/L (ref 135–145)

## 2021-04-10 LAB — TROPONIN I (HIGH SENSITIVITY)
Troponin I (High Sensitivity): 5 ng/L (ref <18)
Troponin I (High Sensitivity): 6 ng/L (ref <18)

## 2021-04-10 LAB — CBC WITH DIFFERENTIAL/PLATELET
Abs Immature Granulocytes: 0.14 10*3/uL — ABNORMAL HIGH (ref 0.00–0.07)
Basophils Absolute: 0.1 10*3/uL (ref 0.0–0.1)
Basophils Relative: 0 %
Eosinophils Absolute: 0.1 10*3/uL (ref 0.0–0.5)
Eosinophils Relative: 1 %
HCT: 41.7 % (ref 36.0–46.0)
Hemoglobin: 13.8 g/dL (ref 12.0–15.0)
Immature Granulocytes: 1 %
Lymphocytes Relative: 7 %
Lymphs Abs: 1.1 10*3/uL (ref 0.7–4.0)
MCH: 31.1 pg (ref 26.0–34.0)
MCHC: 33.1 g/dL (ref 30.0–36.0)
MCV: 93.9 fL (ref 80.0–100.0)
Monocytes Absolute: 1 10*3/uL (ref 0.1–1.0)
Monocytes Relative: 7 %
Neutro Abs: 13.2 10*3/uL — ABNORMAL HIGH (ref 1.7–7.7)
Neutrophils Relative %: 84 %
Platelets: 193 10*3/uL (ref 150–400)
RBC: 4.44 MIL/uL (ref 3.87–5.11)
RDW: 13.1 % (ref 11.5–15.5)
WBC: 15.6 10*3/uL — ABNORMAL HIGH (ref 4.0–10.5)
nRBC: 0 % (ref 0.0–0.2)

## 2021-04-10 LAB — D-DIMER, QUANTITATIVE: D-Dimer, Quant: >20 ug/mL-FEU — ABNORMAL HIGH (ref 0.00–0.50)

## 2021-04-10 MED ORDER — DOXYCYCLINE HYCLATE 100 MG PO CAPS: 100.0000 mg | ORAL_CAPSULE | Freq: Two times a day (BID) | ORAL | 0 refills | Status: AC

## 2021-04-10 MED ORDER — SODIUM CHLORIDE 0.9 % IV BOLUS
1000.0000 mL | Freq: Once | INTRAVENOUS | Status: AC
  Administered 2021-04-10: 1000 mL via INTRAVENOUS

## 2021-04-10 MED ORDER — IOHEXOL 350 MG/ML SOLN
70.0000 mL | Freq: Once | INTRAVENOUS | Status: AC | PRN
  Administered 2021-04-10: 70 mL via INTRAVENOUS

## 2021-04-10 MED ORDER — AMOXICILLIN-POT CLAVULANATE 875-125 MG PO TABS
1.0000 | ORAL_TABLET | Freq: Once | ORAL | Status: AC
  Administered 2021-04-10: 1 via ORAL
  Filled 2021-04-10: qty 1

## 2021-04-10 MED ORDER — AMOXICILLIN-POT CLAVULANATE 875-125 MG PO TABS: 1.0000 | ORAL_TABLET | Freq: Two times a day (BID) | ORAL | 0 refills | Status: DC

## 2021-04-10 MED ORDER — DOXYCYCLINE HYCLATE 100 MG PO TABS
100.0000 mg | ORAL_TABLET | Freq: Once | ORAL | Status: AC
  Administered 2021-04-10: 100 mg via ORAL
  Filled 2021-04-10: qty 1

## 2021-04-10 NOTE — ED Notes (Signed)
Patient transported to CT 

## 2021-04-10 NOTE — ED Provider Notes (Signed)
?[facility] EMERGENCY DEPARTMENT ?Provider Note ? ? ?CSN: [id number] ?Arrival date & time: 04/10/21  1229 ? ?  ? ?History ? ?Chief Complaint  ?Patient presents with  ? Near Syncopal Episode  ? ? ?[name] is a 76 y.o. female history of PE on, obesity, hypertension, hyperlipidemia. ? ?Patient presented via EMS today for a near syncopal episode.  Patient was on a hike today and pushing herself up a hill when she became lightheaded and fatigued, patient needed to sit down and felt like she was going to pass out but did not lose consciousness.  On EMS arrival patient was found to be pale and diaphoretic, she is complaining of abdominal cramping and needing to have a bowel movement.  Patient was also hypotensive on EMS arrival, they gave IV fluids with some improvement.  Patient denies any associated chest pain or shortness of breath.  Patient was tachycardic with EMS which is gradually improved as well.  Patient had a large bowel movement on ER arrival, she reports she is feeling improved.  She denies recent illness, fever, chills, fall/injury, chest pain/shortness of breath or any additional concerns. ?HPI ? ?  ? ?Home Medications ?Prior to Admission medications   ?Medication Sig Start Date End Date Taking? Authorizing Provider  ?atorvastatin (LIPITOR) 20 MG tablet Take 20 mg by mouth daily.   Yes [provider]  ?calcium citrate-vitamin D (CITRACAL+D) 315-200 MG-UNIT tablet Take 1 tablet by mouth daily.    Yes [provider]  ?diltiazem (CARDIZEM CD) 180 MG 24 hr capsule Take 240 mg by mouth daily. 02/09/14  Yes [provider]  ?empagliflozin (JARDIANCE) 25 MG TABS tablet Take by mouth daily.   Yes [provider]  ?glipiZIDE (GLUCOTROL) 10 MG tablet Take 10 mg by mouth daily.   Yes [provider]  ?levothyroxine (SYNTHROID, LEVOTHROID) 100 MCG tablet Take 100 mcg by mouth daily before breakfast.   Yes [provider]  ?lisinopril  (PRINIVIL,ZESTRIL) 20 MG tablet Take 20 mg by mouth daily.   Yes [provider]  ?metFORMIN (GLUCOPHAGE-XR) 750 MG 24 hr tablet Take 750 mg by mouth 2 (two) times daily. 03/02/21  Yes [provider]  ?Olopatadine HCl (PATADAY OP) Place 1 drop into both eyes daily as needed (Allergies).   Yes [provider]  ?omeprazole (PRILOSEC) 20 MG capsule Take 10 mg by mouth as needed.   Yes [provider]  ?rivaroxaban (XARELTO) 20 MG TABS tablet Take 20 mg by mouth daily with supper.   Yes [provider]  ?TRULICITY 3 MG/0.5ML SOPN Inject 3 mg into the skin once a week. 03/15/21  Yes [provider]  ?   ? ?Allergies    ?Poison ivy extract   ? ?Review of Systems   ?Review of Systems Ten systems are reviewed and are negative for acute change except as noted in the HPI ? ?Physical Exam ?Updated Vital Signs ?BP 116/63   Pulse 71   Temp 97.7 ?F (36.5 ?C) (Oral)   Resp 13   SpO2 100%  ?Physical Exam ?Constitutional:   ?   General: She is not in acute distress. ?   Appearance: Normal appearance. She is well-developed. She is not ill-appearing or diaphoretic.  ?HENT:  ?   Head: Normocephalic and atraumatic.  ?Eyes:  ?   General: Vision grossly intact. Gaze aligned appropriately.  ?   Pupils: Pupils are equal, round, and reactive to light.  ?Neck:  ?   Trachea:  Trachea and phonation normal.  ?Cardiovascular:  ?   Rate and Rhythm: Normal rate and regular rhythm.  ?   Pulses: Normal pulses.  ?Pulmonary:  ?   Effort: Pulmonary effort is normal. No respiratory distress.  ?Abdominal:  ?   General: There is no distension.  ?   Palpations: Abdomen is soft.  ?   Tenderness: There is generalized abdominal tenderness. There is no guarding or rebound.  ?Musculoskeletal:     ?   General: Normal range of motion.  ?   Cervical back: Normal range of motion.  ?Skin: ?   General: Skin is warm and dry.  ?Neurological:  ?   Mental Status: She is alert.  ?   GCS: GCS eye subscore is 4. GCS  verbal subscore is 5. GCS motor subscore is 6.  ?   Comments: Speech is clear and goal oriented, follows commands ?Major Cranial nerves without deficit, no facial droop ?Moves extremities without ataxia, coordination intact  ?Psychiatric:     ?   Behavior: Behavior normal.  ? ? ?ED Results / Procedures / Treatments   ?Labs ?(all labs ordered are listed, but only abnormal results are displayed) ?Labs Reviewed  ?BASIC METABOLIC PANEL - Abnormal; Notable for the following components:  ?    Result Value  ? Glucose, Bld 303 (*)   ? BUN 24 (*)   ? Creatinine, Ser 1.60 (*)   ? GFR, Estimated 33 (*)   ? All other components within normal limits  ?CBC WITH DIFFERENTIAL/PLATELET - Abnormal; Notable for the following components:  ? WBC 15.6 (*)   ? Neutro Abs 13.2 (*)   ? Abs Immature Granulocytes 0.14 (*)   ? All other components within normal limits  ?D-DIMER, QUANTITATIVE - Abnormal; Notable for the following components:  ? D-Dimer, Quant >20.00 (*)   ? All other components within normal limits  ?CBG MONITORING, ED - Abnormal; Notable for the following components:  ? Glucose-Capillary 296 (*)   ? All other components within normal limits  ?LACTIC ACID, PLASMA  ?LACTIC ACID, PLASMA  ?HEPATIC FUNCTION PANEL  ?I-STAT CHEM 8, ED  ?TROPONIN I (HIGH SENSITIVITY)  ?TROPONIN I (HIGH SENSITIVITY)  ? ? ?EKG ?EKG Interpretation ? ?Date/Time:  Tuesday April 10 2021 13:05:07 EDT ?Ventricular Rate:  64 ?PR Interval:  179 ?QRS Duration: 122 ?QT Interval:  456 ?QTC Calculation: 471 ?R Axis:   -30 ?Text Interpretation: Sinus rhythm Left ventricular hypertrophy Confirmed by Alvester Chou [id number]) on 04/10/2021 1:08:05 PM ? ?Radiology ?DG Chest Port 1 View ? ?Result Date: 04/10/2021 ?CLINICAL DATA:  Near syncope.  Hypotension EXAM: PORTABLE CHEST 1 VIEW COMPARISON:  03/07/2014 FINDINGS: The heart size and mediastinal contours are within normal limits. Both lungs are clear. The visualized skeletal structures are unremarkable. IMPRESSION: No  active disease. Electronically Signed   By: Marlan Palau M.D.   On: 04/10/2021 13:10   ? ?Procedures ?Procedures  ? ? ?Medications Ordered in ED ?Medications  ?sodium chloride 0.9 % bolus 1,000 mL (1,000 mLs Intravenous New Bag/Given 04/10/21 1316)  ? ? ?ED Course/ Medical Decision Making/ A&P ?Clinical Course as of 04/10/21 1513  ?Tue Apr 10, 2021  ?1251 This is a 76-year female with a history of pulmonary embolism on Xarelto, presenting to the ED with near syncope.  Patient reports that she was walking uphill today outside, exerting herself much more than normal, and she began to feel very lightheaded, overheated, began to see black spots to the edge of her vision,  and felt like passing out.  EMS reports the patient was pale and diaphoretic on their arrival.  She was in a tachycardia in route to the hospital.  They gave her a liter of fluid.  On arrival in the ED the patient had a large bowel movement.  She says that she feels much better now laying in the bed, is no longer nauseated, continues to have some lightheadedness and some sensitivity to light.  Denies chest pain or pressure or history of MI.  Per my review of the external record she had a coronary CT scan performed about 5 months ago as an outpatient by cardiologist with a coronary calcium score of 7, overall fairly low risk for acute coronary disease. [MT]  ?1252 She reports compliance with all of her medication including her Xarelto.  She denies any shortness of breath here in the ED. [MT]  ?1252 She is pending EKG, labs, work-up.  We will give a fluid bolus as well [MT]  ?1511 CBC WITH DIFFERENTIAL(!) ?Leukocytosis of 15.6, no prior infectious symptoms.  Given patient's complaint of abdominal pain we will pursue CT abdomen pelvis to assess for abdominal pathology.  No anemia or thrombocytopenia. [BM]  ?1511 CBG monitoring, ED(!) ?Hyperglycemia noted.  Does not appear to be in DKA. [BM]  ?1511 Basic metabolic panel(!) ?Creatinine of 1.6, BUN 24,  suspect secondary to dehydration.  No emergent electrolyte derangement or gap. [BM]  ?1512 D-dimer, quantitative(!) ?D-dimer greater than 20, will pursue CT PE study. [BM]  ?1512 Troponin I (High Sensitivity) ?Initial troponin w

## 2021-04-10 NOTE — ED Triage Notes (Signed)
Pt BIB [facility] form walking a nature trail with a friend and having a near syncope episode, lowering herself to the ground, A/Ox4. Reports feeling light headedness, dizziness & was pale & clammy upon EMS arrival to scene. Pt reports one episode of emesis & continuing abd cramping. EMS reports her BP while laying was 116/78, standing ortho changes  for her BP was 78/40. After she was given 1L NS in 18gPIV in [place] her laying flat BP was 100/64 upon arrival to ED. CBG 287, 80 bpm (pt reports being on cardizem & that her pressures are usually soft). A/Ox4. ?

## 2021-04-10 NOTE — ED Notes (Signed)
Ambulated pt to bathroom remained 92% or above ?

## 2021-04-10 NOTE — Discharge Instructions (Addendum)
You came to the emergency department today after having an episode where he almost passed out.  This was likely caused by dehydration.  The CT scan of your chest did not show any blood clots however did show signs concerning for pneumonia.  Due to this she was started on the antibiotics Augmentin and doxycycline.  Please take these antibiotics as prescribed and follow-up closely with your primary care doctor. ? ?Due to having an episode where he almost passed out he will need to follow-up with the cardiology team as well.  I have placed a referral for cardiology and they should reach out to you in the next 3 business days to schedule a follow-up appointment. ? ?Get help right away if: ?You faint. ?You have any of these symptoms that may indicate trouble with your heart: ?Fast or irregular heartbeats (palpitations). ?Unusual pain in your chest, abdomen, or back. ?Shortness of breath. ?You have a seizure. ?You have a severe headache. ?You are confused. ?You have vision problems. ?You have severe weakness or trouble walking. ?You are bleeding from your mouth or rectum, or have black or tarry stool. ?

## 2021-04-10 NOTE — ED Provider Notes (Signed)
Care of patient assumed from [place] at 1530.  Agree with history, physical exam and plan.  See their note for further details. Briefly, 76 year old female with pertinent history of PE, obesity, hypertension, and hyperlipidemia.  Presents to the emergency department with a chief complaint of near syncopal episode.  Patient states that she was on a hike and pushing herself up hill when she became lightheaded and fatigued.  Upon EMS arrival patient was pale diaphoretic.  Patient also reported abdominal cramping and feeling like needing to have a bowel movement.  Patient had a large bowel movement upon arriving to the emergency department.  Reported feeling improved after having bowel movement. ? ? ?Physical Exam  ?BP (!) 148/57   Pulse 77   Temp 97.7 ?F (36.5 ?C) (Oral)   Resp 15   SpO2 99%  ? ?Physical Exam ?Vitals and nursing note reviewed. Exam conducted with a chaperone present (Female nurse tech present as chaperone).  ?Constitutional:   ?   General: She is not in acute distress. ?   Appearance: She is not ill-appearing, toxic-appearing or diaphoretic.  ?HENT:  ?   Head: Normocephalic.  ?Eyes:  ?   General: No scleral icterus.    ?   Right eye: No discharge.     ?   Left eye: No discharge.  ?Cardiovascular:  ?   Rate and Rhythm: Normal rate.  ?   Pulses:     ?     Radial pulses are 2+ on the right side and 2+ on the left side.  ?Pulmonary:  ?   Effort: Pulmonary effort is normal. No tachypnea, bradypnea or respiratory distress.  ?   Breath sounds: Normal breath sounds. No stridor.  ?   Comments: Speaks in full complete sentences without difficulty ?Abdominal:  ?   General: Abdomen is flat. There is no distension. There are no signs of injury.  ?   Palpations: There is no mass or pulsatile mass.  ?   Tenderness: There is no abdominal tenderness. There is no guarding or rebound.  ?Genitourinary: ?   Rectum: Tenderness and anal fissure present. No mass, external hemorrhoid or internal hemorrhoid. Normal anal  tone.  ?   Comments: Anal fissure noted at 1 o'clock position.  Patient has associated tenderness with anal fissure.  Minimal amount of light brown stool noted in rectal vault.  No melena.  Patient does have blood noted around rectum and on brief she is wearing.  No active bleeding noted at this time. ?Skin: ?   General: Skin is warm and dry.  ?Neurological:  ?   General: No focal deficit present.  ?   Mental Status: She is alert.  ?   GCS: GCS eye subscore is 4. GCS verbal subscore is 5. GCS motor subscore is 6.  ?Psychiatric:     ?   Behavior: Behavior is cooperative.  ? ? ?Procedures  ?Procedures ? ?ED Course / MDM  ? ?Clinical Course as of 04/10/21 1911  ?Tue Apr 10, 2021  ?1251 This is a 35-year female with a history of pulmonary embolism on Xarelto, presenting to the ED with near syncope.  Patient reports that she was walking uphill today outside, exerting herself much more than normal, and she began to feel very lightheaded, overheated, began to see black spots to the edge of her vision, and felt like passing out.  EMS reports the patient was pale and diaphoretic on their arrival.  She was in a tachycardia in route  to the hospital.  They gave her a liter of fluid.  On arrival in the ED the patient had a large bowel movement.  She says that she feels much better now laying in the bed, is no longer nauseated, continues to have some lightheadedness and some sensitivity to light.  Denies chest pain or pressure or history of MI.  Per my review of the external record she had a coronary CT scan performed about 5 months ago as an outpatient by cardiologist with a coronary calcium score of 7, overall fairly low risk for acute coronary disease. [MT]  ?1252 She reports compliance with all of her medication including her Xarelto.  She denies any shortness of breath here in the ED. [MT]  ?1252 She is pending EKG, labs, work-up.  We will give a fluid bolus as well [MT]  ?1511 CBC WITH DIFFERENTIAL(!) ?Leukocytosis of 15.6,  no prior infectious symptoms.  Given patient's complaint of abdominal pain we will pursue CT abdomen pelvis to assess for abdominal pathology.  No anemia or thrombocytopenia. [BM]  ?1511 CBG monitoring, ED(!) ?Hyperglycemia noted.  Does not appear to be in DKA. [BM]  ?0000000 Basic metabolic panel(!) ?Creatinine of 1.6, BUN 24, suspect secondary to dehydration.  No emergent electrolyte derangement or gap. [BM]  ?1512 D-dimer, quantitative(!) ?D-dimer greater than 20, will pursue CT PE study. [BM]  ?1512 Troponin I (High Sensitivity) ?Initial troponin within normal limits.  Will need second troponin. [BM]  ?[place] 1 View ?I have personally reviewed patient's chest x-ray, I do not appreciate any acute cardiopulmonary pathology.  I agree with radiologist interpretation. [BM]  ?1513 EKG 12-Lead ?I personally reviewed patient's EKG.  I do not appreciate any obvious acute ischemic changes.  EKG reviewed by attending physician Dr. Langston Masker [BM]  ?  ?Clinical Course User Index ?[BM] Deliah Boston, PA-C ?[MT] Wyvonnia Dusky, MD  ? ?Medical Decision Making ?Amount and/or Complexity of Data Reviewed ?Labs: ordered. Decision-making details documented in ED Course. ?Radiology: ordered. Decision-making details documented in ED Course. ?ECG/medicine tests: ordered. Decision-making details documented in ED Course. ? ?Risk ?Prescription drug management. ? ? ?At time of handoff CT imaging, lactic acid, and second troponin are pending. ? ?CTA of chest shows No evidence of central PE.  Faint groundglass density seen in the mid and lower lung fields suggesting possible interstitial pneumonia. ? ?CT abdomen pelvis showsDiverticulosis of colon without focal acute diverticulitis.  Mild diffuse wall thickening in the sigmoid colon. ? ?Second troponin 6 with delta of +1.  Lactic acid within normal limits. ? ?Patient able to stand and ambulate without reports of lightheadedness.  Oxygen saturation remains greater than 90% on  room air with ambulation. ? ?While in the emergency department patient does report having rectal bleeding.  On physical exam patient is noted to have anal fissure.  Patient did have 3 large bowel movements while in the emergency department and she does report straining during these bowel movements.  Suspect that bleeding is secondary from anal fissure.  No active bleeding at this time. ? ?Shared decision making with patient about admission versus discharge and close follow-up with cardiology.  Patient elects for discharge at this time.  Patient is hemodynamic stable at this time.  Due to patient's findings of interstitial pneumonia we will treat with Augmentin and doxycycline.  Patient to follow-up closely with her primary care provider for repeat evaluation. ? ?Discussed results, findings, treatment and follow up. Patient advised of return precautions. Patient verbalized understanding  and agreed with plan. ? ?Patient care discussed with Dr. Matilde Sprang ? ? ? ?  ?Loni Beckwith, PA-C ?04/11/21 0243 ? ?  ?Teressa Lower, MD ?04/12/21 0030 ? ?

## 2021-04-11 LAB — POC OCCULT BLOOD, ED: Fecal Occult Bld: POSITIVE — AB

## 2021-04-17 DIAGNOSIS — I10 Essential (primary) hypertension: Secondary | ICD-10-CM | POA: Diagnosis not present

## 2021-04-17 DIAGNOSIS — Z86711 Personal history of pulmonary embolism: Secondary | ICD-10-CM | POA: Diagnosis not present

## 2021-04-17 DIAGNOSIS — K602 Anal fissure, unspecified: Secondary | ICD-10-CM | POA: Diagnosis not present

## 2021-04-17 DIAGNOSIS — E1121 Type 2 diabetes mellitus with diabetic nephropathy: Secondary | ICD-10-CM | POA: Diagnosis not present

## 2021-04-17 DIAGNOSIS — R55 Syncope and collapse: Secondary | ICD-10-CM | POA: Diagnosis not present

## 2021-04-17 DIAGNOSIS — J189 Pneumonia, unspecified organism: Secondary | ICD-10-CM | POA: Diagnosis not present

## 2021-04-24 NOTE — Progress Notes (Signed)
?Cardiology Clinic Note  ? ?Patient Name: [name] ?Date of Encounter: 04/27/2021 ? ?Primary Care Provider:  Gweneth Dimitri, MD ?Primary Cardiologist:  Jens Som  ? ?Patient Profile  ?  ?76 year old female patient with known history of hypertension, hyperlipidemia, type 2 diabetes, hypothyroidism, pulmonary emboli (03/2014); normal LV function per echo in 2016, seen last by Dr. Jens Som on 10/30/2020 for complaints of chronic dyspnea especially with vigorous activities.  She was to continue on Xarelto discontinue aspirin and follow blood pressures at home as they were found to be elevated in the office.   ? ?Also follow-up coronary CTA was ordered to evaluate for need for advancement of statin therapy.  She was found to have a coronary calcium score of 7, dilation of the main pulmonary artery was noted at 40 mm.  Statin therapy was not advanced per GDMT recommendation from American Heart Association/ACC. ? ? ?Past Medical History  ?  ?Past Medical History:  ?Diagnosis Date  ? Diabetes (HCC)   ? Type 2  ? Gallstones 03/07/2014  ? HLD (hyperlipidemia)   ? Hypertension   ? Hypothyroid   ? PE (pulmonary embolism) 8 2010  ? Pneumonia   ? PONV (postoperative nausea and vomiting)   ? nausea  ? Pregnancy   ? ?Past Surgical History:  ?Procedure Laterality Date  ? CHOLECYSTECTOMY  10/18/2014  ? Procedure: LAPAROSCOPIC CHOLECYSTECTOMY WITH INTRAOPERATIVE CHOLANGIOGRAM;  Surgeon: Harriette Bouillon, MD;  Location: [facility] OR;  Service: General;;  ? COLONOSCOPY W/ POLYPECTOMY    ? SKIN CANCER EXCISION    ? nose  ? TUMOR EXCISION Right   ? Fatty tumor removed from right breast  ? TUMOR REMOVAL Right   ? cheek  ? ? ?Allergies ? ?Allergies  ?Allergen Reactions  ? Poison Ivy Extract Rash  ?  Weeping sores  ? ? ?History of Present Illness  ?  ?[name] comes today for ongoing assessment and management of hypertension, history of PE, chronic dyspnea on exertion with CTA score of 7 on recent coronary artery evaluation (11/20/2020). ? ?Seen  in the ED on 04/10/2021 for a near syncopal episode was found to have dehydration, interstitial pneumonia pneumonia.  She did have some bleeding from an anal fissure but no significant anemia was noted on Xarelto was given hydration, antibiotic therapy, and has recovered well. ? ?Today the patient offers no cardiac complaints.  She is trying to keep yourself hydrated drinking more fluids, remains active walking in her neighborhood. ? ? ?Home Medications  ?  ?Current Outpatient Medications  ?Medication Sig Dispense Refill  ? atorvastatin (LIPITOR) 20 MG tablet Take 20 mg by mouth daily.    ? calcium citrate-vitamin D (CITRACAL+D) 315-200 MG-UNIT tablet Take 1 tablet by mouth daily.     ? diltiazem (CARDIZEM CD) 180 MG 24 hr capsule Take 240 mg by mouth daily.    ? empagliflozin (JARDIANCE) 25 MG TABS tablet Take by mouth daily.    ? glipiZIDE (GLUCOTROL) 10 MG tablet Take 10 mg by mouth daily.    ? levothyroxine (SYNTHROID, LEVOTHROID) 100 MCG tablet Take 100 mcg by mouth daily before breakfast.    ? lisinopril (PRINIVIL,ZESTRIL) 20 MG tablet Take 20 mg by mouth daily.    ? lisinopril (ZESTRIL) 20 MG tablet 1/2 tablet    ? metFORMIN (GLUCOPHAGE-XR) 750 MG 24 hr tablet Take 750 mg by mouth 2 (two) times daily.    ? Olopatadine HCl (PATADAY OP) Place 1 drop into both eyes daily as needed (Allergies).    ?  omeprazole (PRILOSEC) 20 MG capsule Take 10 mg by mouth as needed.    ? rivaroxaban (XARELTO) 20 MG TABS tablet Take 20 mg by mouth daily with supper.    ? TRULICITY 3 MG/0.5ML SOPN Inject 3 mg into the skin once a week.    ? ?No current facility-administered medications for this visit.  ?  ? ?Family History  ?  ?Family History  ?Problem Relation Age of Onset  ? Heart failure Mother   ? Heart failure Father   ? ?She indicated that her mother is deceased. She indicated that her father is deceased. ? ?Social History  ?  ?Social History  ? ?Socioeconomic History  ? Marital status: Widowed  ?  Spouse name: Not on file  ?  Number of children: 4  ? Years of education: Not on file  ? Highest education level: Not on file  ?Occupational History  ? Not on file  ?Tobacco Use  ? Smoking status: Never  ? Smokeless tobacco: Not on file  ?Substance and Sexual Activity  ? Alcohol use: No  ?  Alcohol/week: 0.0 standard drinks  ? Drug use: No  ? Sexual activity: Not on file  ?Other Topics Concern  ? Not on file  ?Social History Narrative  ? Not on file  ? ?Social Determinants of Health  ? ?Financial Resource Strain: Not on file  ?Food Insecurity: Not on file  ?Transportation Needs: Not on file  ?Physical Activity: Not on file  ?Stress: Not on file  ?Social Connections: Not on file  ?Intimate Partner Violence: Not on file  ?  ? ?Review of Systems  ?  ?General:  No chills, fever, night sweats or weight changes.  ?Cardiovascular:  No chest pain, dyspnea on exertion, edema, orthopnea, palpitations, paroxysmal nocturnal dyspnea. ?Dermatological: No rash, lesions/masses ?Respiratory: No cough, dyspnea ?Urologic: No hematuria, dysuria ?Abdominal:   No nausea, vomiting, diarrhea, bright red blood per rectum, melena, or hematemesis ?Neurologic:  No visual changes, wkns, changes in mental status. ?All other systems reviewed and are otherwise negative except as noted above. ? ? ?Physical Exam  ?  ?VS:  BP 128/66   Pulse 78   Ht 5' 9.5" (1.765 m)   Wt 222 lb 3.2 oz (100.8 kg)   SpO2 96%   BMI 32.34 kg/m?  , BMI Body mass index is 32.34 kg/m?. ?    ?GEN: Well nourished, well developed, in no acute distress. ?HEENT: normal. ?Neck: Supple, no JVD, carotid bruits, or masses. ?Cardiac: RRR, no murmurs, rubs, or gallops. No clubbing, cyanosis, edema.  Radials/DP/PT 2+ and equal bilaterally.  ?Respiratory:  Respirations regular and unlabored, clear to auscultation bilaterally. ?GI: Soft, nontender, nondistended, BS + x 4. ?MS: no deformity or atrophy. ?Skin: warm and dry, no rash. ?Neuro:  Strength and sensation are intact. ?Psych: Normal affect. ? ?Accessory  Clinical Findings  ? ? ?Lab Results  ?Component Value Date  ? WBC 15.6 (H) 04/10/2021  ? HGB 13.8 04/10/2021  ? HCT 41.7 04/10/2021  ? MCV 93.9 04/10/2021  ? PLT 193 04/10/2021  ? ?Lab Results  ?Component Value Date  ? CREATININE 1.60 (H) 04/10/2021  ? BUN 24 (H) 04/10/2021  ? NA 137 04/10/2021  ? K 3.8 04/10/2021  ? CL 101 04/10/2021  ? CO2 23 04/10/2021  ? ?Lab Results  ?Component Value Date  ? ALT 16 04/10/2021  ? AST 26 04/10/2021  ? ALKPHOS 61 04/10/2021  ? BILITOT 0.7 04/10/2021  ? ?No results found for: CHOL, HDL,  LDLCALC, LDLDIRECT, TRIG, CHOLHDL  ?Lab Results  ?Component Value Date  ? HGBA1C 9.2 (H) 03/08/2014  ? ? ?Review of Prior Studies: ?Coronary CTA 11/20/2020 ?IMPRESSION: ?1. Coronary calcium score of 7. This was 28th percentile for age, ?gender, and race matched controls. ?  ?2. Aortic atherosclerosis. ?  ?3. Dilation of the main pulmonary artery 40 mm, on non-contrasted ?study. This can be associated with the presence of pulmonary ?hypertension; clinical correlation advised. ? ?Echocardiogram 10/18/2014 ?Left ventricle: The cavity size was normal. There was mild focal  ?  basal hypertrophy of the septum. Systolic function was normal.  ?  The estimated ejection fraction was in the range of 50% to 55%.  ?  Diffuse hypokinesis. Doppler parameters are consistent with  ?  abnormal left ventricular relaxation (grade 1 diastolic  ?  dysfunction).  ?- Right ventricle: Systolic function was normal.  ? ? ?Assessment & Plan  ? ?1.  Coronary artery disease: Coronary CTA calcium score of 7.  Continue secondary management with statin therapy, lifestyle changes to include low-cholesterol low-sodium diet, and continued activity.  No planned ischemic evaluation is planned at this time. ? ?2.  Hypercholesterolemia: Remains on atorvastatin 20 mg daily.  Is followed by PCP for ongoing lab management.  If not completed on follow-up in November with Dr. Jens Som this should be ordered. ? ?3.  Type 2 diabetes: Followed by  primary care physician, recently had Trulicity dose increased.  I have advised her to remain hydrated.  And check blood sugars as directed by PCP. ? ?4.  Hypertension: Currently blood pressure is well contr

## 2021-04-27 ENCOUNTER — Encounter: Payer: Self-pay | Admitting: Adult Health

## 2021-04-27 ENCOUNTER — Ambulatory Visit: Payer: Medicare PPO | Admitting: Adult Health

## 2021-04-27 VITALS — BP 128/66 | HR 78 | Ht 69.5 in | Wt 222.2 lb

## 2021-04-27 DIAGNOSIS — I2510 Atherosclerotic heart disease of native coronary artery without angina pectoris: Secondary | ICD-10-CM

## 2021-04-27 DIAGNOSIS — I824Y9 Acute embolism and thrombosis of unspecified deep veins of unspecified proximal lower extremity: Secondary | ICD-10-CM | POA: Diagnosis not present

## 2021-04-27 DIAGNOSIS — I10 Essential (primary) hypertension: Secondary | ICD-10-CM

## 2021-04-27 DIAGNOSIS — R55 Syncope and collapse: Secondary | ICD-10-CM | POA: Diagnosis not present

## 2021-04-27 DIAGNOSIS — E7800 Pure hypercholesterolemia, unspecified: Secondary | ICD-10-CM | POA: Diagnosis not present

## 2021-04-27 NOTE — Patient Instructions (Signed)
Medication Instructions:  ?No Changes ?*If you need a refill on your cardiac medications before your next appointment, please call your pharmacy* ? ? ?Lab Work: ?No Labs ?If you have labs (blood work) drawn today and your tests are completely normal, you will receive your results only by: ?MyChart Message (if you have MyChart) OR ?A paper copy in the mail ?If you have any lab test that is abnormal or we need to change your treatment, we will call you to review the results. ? ? ?Testing/Procedures: ?No Testing ? ? ?Follow-Up: ?At [facility], you and your health needs are our priority.  As part of our continuing mission to provide you with exceptional heart care, we have created designated Provider Care Teams.  These Care Teams include your primary Cardiologist (physician) and Advanced Practice Providers (APPs -  Physician Assistants and Nurse Practitioners) who all work together to provide you with the care you need, when you need it. ? ?We recommend signing up for the patient portal called "MyChart".  Sign up information is provided on this After Visit Summary.  MyChart is used to connect with patients for Virtual Visits (Telemedicine).  Patients are able to view lab/test results, encounter notes, upcoming appointments, etc.  Non-urgent messages can be sent to your provider as well.   ?To learn more about what you can do with MyChart, go to [email or web address].   ? ?Your next appointment:   ?7 month(s) ? ?The format for your next appointment:   ?In Person ? ?Provider:   ?Olga Millers, MD ? ? :1}  ? ? ? ? ?Important Information About Sugar ? ? ? ? ?  ?

## 2021-06-06 DIAGNOSIS — L578 Other skin changes due to chronic exposure to nonionizing radiation: Secondary | ICD-10-CM | POA: Diagnosis not present

## 2021-06-06 DIAGNOSIS — L821 Other seborrheic keratosis: Secondary | ICD-10-CM | POA: Diagnosis not present

## 2021-06-06 DIAGNOSIS — D0359 Melanoma in situ of other part of trunk: Secondary | ICD-10-CM | POA: Diagnosis not present

## 2021-06-06 DIAGNOSIS — Z86018 Personal history of other benign neoplasm: Secondary | ICD-10-CM | POA: Diagnosis not present

## 2021-06-06 DIAGNOSIS — D485 Neoplasm of uncertain behavior of skin: Secondary | ICD-10-CM | POA: Diagnosis not present

## 2021-06-06 DIAGNOSIS — L814 Other melanin hyperpigmentation: Secondary | ICD-10-CM | POA: Diagnosis not present

## 2021-06-06 DIAGNOSIS — D225 Melanocytic nevi of trunk: Secondary | ICD-10-CM | POA: Diagnosis not present

## 2021-06-06 DIAGNOSIS — D2262 Melanocytic nevi of left upper limb, including shoulder: Secondary | ICD-10-CM | POA: Diagnosis not present

## 2021-06-06 DIAGNOSIS — Z86006 Personal history of melanoma in-situ: Secondary | ICD-10-CM | POA: Diagnosis not present

## 2021-06-06 DIAGNOSIS — Z85828 Personal history of other malignant neoplasm of skin: Secondary | ICD-10-CM | POA: Diagnosis not present

## 2021-07-05 DIAGNOSIS — L905 Scar conditions and fibrosis of skin: Secondary | ICD-10-CM | POA: Diagnosis not present

## 2021-07-05 DIAGNOSIS — D0359 Melanoma in situ of other part of trunk: Secondary | ICD-10-CM | POA: Diagnosis not present

## 2021-08-08 ENCOUNTER — Other Ambulatory Visit: Payer: Self-pay | Admitting: *Deleted

## 2021-08-08 NOTE — Patient Outreach (Signed)
  Care Coordination   08/08/2021 Name: [name] MRN: [id number] DOB: [birth year]   Care Coordination Outreach Attempts:  An unsuccessful telephone outreach was attempted today to offer the patient information about available care coordination services as a benefit of their health plan.   Follow Up Plan:  Additional outreach attempts will be made to offer the patient care coordination information and services.   Encounter Outcome:  No Answer  Care Coordination Interventions Activated:  Yes   Care Coordination Interventions:  No, not indicated    SIG Gean Maidens BSN RN [facility] [phone]

## 2021-08-14 DIAGNOSIS — Z7901 Long term (current) use of anticoagulants: Secondary | ICD-10-CM | POA: Diagnosis not present

## 2021-08-14 DIAGNOSIS — E039 Hypothyroidism, unspecified: Secondary | ICD-10-CM | POA: Diagnosis not present

## 2021-08-14 DIAGNOSIS — E559 Vitamin D deficiency, unspecified: Secondary | ICD-10-CM | POA: Diagnosis not present

## 2021-08-14 DIAGNOSIS — E1121 Type 2 diabetes mellitus with diabetic nephropathy: Secondary | ICD-10-CM | POA: Diagnosis not present

## 2021-08-14 DIAGNOSIS — Z0000 Encounter for general adult medical examination without abnormal findings: Secondary | ICD-10-CM | POA: Diagnosis not present

## 2021-08-14 DIAGNOSIS — E782 Mixed hyperlipidemia: Secondary | ICD-10-CM | POA: Diagnosis not present

## 2021-08-14 DIAGNOSIS — Z1389 Encounter for screening for other disorder: Secondary | ICD-10-CM | POA: Diagnosis not present

## 2021-08-20 DIAGNOSIS — Z86711 Personal history of pulmonary embolism: Secondary | ICD-10-CM | POA: Diagnosis not present

## 2021-08-20 DIAGNOSIS — E782 Mixed hyperlipidemia: Secondary | ICD-10-CM | POA: Diagnosis not present

## 2021-08-20 DIAGNOSIS — C4360 Malignant melanoma of unspecified upper limb, including shoulder: Secondary | ICD-10-CM | POA: Diagnosis not present

## 2021-08-20 DIAGNOSIS — Z23 Encounter for immunization: Secondary | ICD-10-CM | POA: Diagnosis not present

## 2021-08-20 DIAGNOSIS — E1121 Type 2 diabetes mellitus with diabetic nephropathy: Secondary | ICD-10-CM | POA: Diagnosis not present

## 2021-08-20 DIAGNOSIS — E039 Hypothyroidism, unspecified: Secondary | ICD-10-CM | POA: Diagnosis not present

## 2021-08-20 DIAGNOSIS — R809 Proteinuria, unspecified: Secondary | ICD-10-CM | POA: Diagnosis not present

## 2021-08-20 DIAGNOSIS — I10 Essential (primary) hypertension: Secondary | ICD-10-CM | POA: Diagnosis not present

## 2021-08-20 DIAGNOSIS — M75101 Unspecified rotator cuff tear or rupture of right shoulder, not specified as traumatic: Secondary | ICD-10-CM | POA: Diagnosis not present

## 2021-08-30 DIAGNOSIS — M75101 Unspecified rotator cuff tear or rupture of right shoulder, not specified as traumatic: Secondary | ICD-10-CM | POA: Diagnosis not present

## 2021-09-03 DIAGNOSIS — M75101 Unspecified rotator cuff tear or rupture of right shoulder, not specified as traumatic: Secondary | ICD-10-CM | POA: Diagnosis not present

## 2021-09-06 DIAGNOSIS — M75101 Unspecified rotator cuff tear or rupture of right shoulder, not specified as traumatic: Secondary | ICD-10-CM | POA: Diagnosis not present

## 2021-09-12 DIAGNOSIS — M75101 Unspecified rotator cuff tear or rupture of right shoulder, not specified as traumatic: Secondary | ICD-10-CM | POA: Diagnosis not present

## 2021-09-14 DIAGNOSIS — M75101 Unspecified rotator cuff tear or rupture of right shoulder, not specified as traumatic: Secondary | ICD-10-CM | POA: Diagnosis not present

## 2021-09-17 DIAGNOSIS — M75101 Unspecified rotator cuff tear or rupture of right shoulder, not specified as traumatic: Secondary | ICD-10-CM | POA: Diagnosis not present

## 2021-09-20 DIAGNOSIS — M75101 Unspecified rotator cuff tear or rupture of right shoulder, not specified as traumatic: Secondary | ICD-10-CM | POA: Diagnosis not present

## 2021-09-27 DIAGNOSIS — M75101 Unspecified rotator cuff tear or rupture of right shoulder, not specified as traumatic: Secondary | ICD-10-CM | POA: Diagnosis not present

## 2021-10-01 DIAGNOSIS — M75101 Unspecified rotator cuff tear or rupture of right shoulder, not specified as traumatic: Secondary | ICD-10-CM | POA: Diagnosis not present

## 2021-10-04 DIAGNOSIS — M75101 Unspecified rotator cuff tear or rupture of right shoulder, not specified as traumatic: Secondary | ICD-10-CM | POA: Diagnosis not present

## 2021-10-09 DIAGNOSIS — M75101 Unspecified rotator cuff tear or rupture of right shoulder, not specified as traumatic: Secondary | ICD-10-CM | POA: Diagnosis not present

## 2021-10-11 DIAGNOSIS — M75101 Unspecified rotator cuff tear or rupture of right shoulder, not specified as traumatic: Secondary | ICD-10-CM | POA: Diagnosis not present

## 2021-12-17 DIAGNOSIS — D485 Neoplasm of uncertain behavior of skin: Secondary | ICD-10-CM | POA: Diagnosis not present

## 2021-12-17 DIAGNOSIS — D2262 Melanocytic nevi of left upper limb, including shoulder: Secondary | ICD-10-CM | POA: Diagnosis not present

## 2021-12-17 DIAGNOSIS — L821 Other seborrheic keratosis: Secondary | ICD-10-CM | POA: Diagnosis not present

## 2021-12-17 DIAGNOSIS — D225 Melanocytic nevi of trunk: Secondary | ICD-10-CM | POA: Diagnosis not present

## 2021-12-17 DIAGNOSIS — Z85828 Personal history of other malignant neoplasm of skin: Secondary | ICD-10-CM | POA: Diagnosis not present

## 2021-12-17 DIAGNOSIS — L814 Other melanin hyperpigmentation: Secondary | ICD-10-CM | POA: Diagnosis not present

## 2021-12-17 DIAGNOSIS — Z86018 Personal history of other benign neoplasm: Secondary | ICD-10-CM | POA: Diagnosis not present

## 2021-12-17 DIAGNOSIS — Z86006 Personal history of melanoma in-situ: Secondary | ICD-10-CM | POA: Diagnosis not present

## 2021-12-17 DIAGNOSIS — L578 Other skin changes due to chronic exposure to nonionizing radiation: Secondary | ICD-10-CM | POA: Diagnosis not present

## 2022-01-03 ENCOUNTER — Telehealth: Payer: Self-pay

## 2022-01-03 NOTE — Patient Outreach (Signed)
  Care Coordination   01/03/2022 Name: [name] MRN: [id number] DOB: [birth year]   Care Coordination Outreach Attempts:  An unsuccessful telephone outreach was attempted today to offer the patient information about available care coordination services as a benefit of their health plan.   Follow Up Plan:  Additional outreach attempts will be made to offer the patient care coordination information and services.   Encounter Outcome:  No Answer   Care Coordination Interventions:  No, not indicated    Dudley Major RN, BSN,CCM, CDE Care Management Coordinator [facility] [phone]

## 2022-01-09 DIAGNOSIS — Z1231 Encounter for screening mammogram for malignant neoplasm of breast: Secondary | ICD-10-CM | POA: Diagnosis not present

## 2022-01-14 ENCOUNTER — Telehealth: Payer: Self-pay

## 2022-01-14 NOTE — Patient Outreach (Signed)
  Care Coordination   Initial Visit Note   01/14/2022 Name: [name] MRN: [id number] DOB: [birth year]  [name] is a 77 y.o. year old female who sees Sarah Caraway, MD for primary care. I spoke with  [name] by phone today.  What matters to the patients health and wellness today?  I am doing good and I think I keeping my diabetes under control.  Denies any hypoglycemia    Goals Addressed             This Visit's Progress    COMPLETED: Care Coordination Activities - no follow up required       Care Coordination Interventions: Advised patient to be sure to eat breakfast when taking glipizide Provided education to patient re: hypoglycemia, care coordination services Advised patient, providing education and rationale, to check cbg daily and record, calling provider for findings outside established parameters  Assessed social determinant of health barriers No further needs and declines follow up at this time         SDOH assessments and interventions completed:  Yes  SDOH Interventions Today    Flowsheet Row Most Recent Value  SDOH Interventions   Food Insecurity Interventions Intervention Not Indicated  Housing Interventions Intervention Not Indicated  Transportation Interventions Intervention Not Indicated  Utilities Interventions Intervention Not Indicated        Care Coordination Interventions:  Yes, provided   Follow up plan: No further intervention required.   Encounter Outcome:  Pt. Visit Completed  Sarah Garter RN, BSN,CCM, CDE Care Management Coordinator [facility] [phone]

## 2022-01-14 NOTE — Patient Instructions (Signed)
Visit Information  Thank you for taking time to visit with me today. Please don't hesitate to contact me if I can be of assistance to you.   Following are the goals we discussed today:   Goals Addressed             This Visit's Progress    COMPLETED: Care Coordination Activities - no follow up required       Care Coordination Interventions: Advised patient to be sure to eat breakfast when taking glipizide Provided education to patient re: hypoglycemia, care coordination services Advised patient, providing education and rationale, to check cbg daily and record, calling provider for findings outside established parameters  Assessed social determinant of health barriers No further needs and declines follow up at this time          If you are experiencing a Mental Health or [place] or need someone to talk to, please call the Suicide and Crisis Lifeline: 988 call the [place] National Suicide Prevention Lifeline: 813-720-9746 or TTY: 430-819-8952 TTY 352-650-0050) to talk to a trained counselor call 1-800-273-TALK (toll free, 24 hour hotline) go to [facility] [address] [place] [phone]) call 911   Patient verbalizes understanding of instructions and care plan provided today and agrees to view in [place]. Active MyChart status and patient understanding of how to access instructions and care plan via MyChart confirmed with patient.     No further follow up required:    Peter Garter RN, Jackquline [place], [facility] [phone]

## 2022-02-11 DIAGNOSIS — H43811 Vitreous degeneration, right eye: Secondary | ICD-10-CM | POA: Diagnosis not present

## 2022-02-11 DIAGNOSIS — H0102B Squamous blepharitis left eye, upper and lower eyelids: Secondary | ICD-10-CM | POA: Diagnosis not present

## 2022-02-11 DIAGNOSIS — H1045 Other chronic allergic conjunctivitis: Secondary | ICD-10-CM | POA: Diagnosis not present

## 2022-02-11 DIAGNOSIS — H0102A Squamous blepharitis right eye, upper and lower eyelids: Secondary | ICD-10-CM | POA: Diagnosis not present

## 2022-02-11 DIAGNOSIS — Z961 Presence of intraocular lens: Secondary | ICD-10-CM | POA: Diagnosis not present

## 2022-02-11 DIAGNOSIS — E119 Type 2 diabetes mellitus without complications: Secondary | ICD-10-CM | POA: Diagnosis not present

## 2022-02-11 DIAGNOSIS — H53021 Refractive amblyopia, right eye: Secondary | ICD-10-CM | POA: Diagnosis not present

## 2022-02-11 DIAGNOSIS — H40013 Open angle with borderline findings, low risk, bilateral: Secondary | ICD-10-CM | POA: Diagnosis not present

## 2022-02-25 DIAGNOSIS — E039 Hypothyroidism, unspecified: Secondary | ICD-10-CM | POA: Diagnosis not present

## 2022-02-25 DIAGNOSIS — R809 Proteinuria, unspecified: Secondary | ICD-10-CM | POA: Diagnosis not present

## 2022-02-25 DIAGNOSIS — Z79899 Other long term (current) drug therapy: Secondary | ICD-10-CM | POA: Diagnosis not present

## 2022-02-25 DIAGNOSIS — E1121 Type 2 diabetes mellitus with diabetic nephropathy: Secondary | ICD-10-CM | POA: Diagnosis not present

## 2022-02-28 DIAGNOSIS — E1165 Type 2 diabetes mellitus with hyperglycemia: Secondary | ICD-10-CM | POA: Diagnosis not present

## 2022-02-28 DIAGNOSIS — Z79899 Other long term (current) drug therapy: Secondary | ICD-10-CM | POA: Diagnosis not present

## 2022-02-28 DIAGNOSIS — E039 Hypothyroidism, unspecified: Secondary | ICD-10-CM | POA: Diagnosis not present

## 2022-02-28 DIAGNOSIS — E785 Hyperlipidemia, unspecified: Secondary | ICD-10-CM | POA: Diagnosis not present

## 2022-02-28 DIAGNOSIS — I2510 Atherosclerotic heart disease of native coronary artery without angina pectoris: Secondary | ICD-10-CM | POA: Diagnosis not present

## 2022-02-28 DIAGNOSIS — N1831 Chronic kidney disease, stage 3a: Secondary | ICD-10-CM | POA: Diagnosis not present

## 2022-02-28 DIAGNOSIS — R809 Proteinuria, unspecified: Secondary | ICD-10-CM | POA: Diagnosis not present

## 2022-02-28 DIAGNOSIS — I10 Essential (primary) hypertension: Secondary | ICD-10-CM | POA: Diagnosis not present

## 2022-02-28 DIAGNOSIS — E782 Mixed hyperlipidemia: Secondary | ICD-10-CM | POA: Diagnosis not present

## 2022-04-17 NOTE — Progress Notes (Signed)
HPI: Follow-up hypertension and hyperlipidemia. Patient had pulmonary embolus March 2016.  Echocardiogram October 2016 showed normal LV function, grade 1 diastolic dysfunction.  Calcium score November 2022 7 which was 28 percentile; main pulmonary artery 40 mm suggesting pulmonary hypertension.  Since last seen 10/22, she denies dyspnea, chest pain, palpitations or syncope.  She has had several episodes of dizziness with standing or when she had the flu.  Her lisinopril dose was decreased and though symptoms have improved.  Current Outpatient Medications  Medication Sig Dispense Refill   atorvastatin (LIPITOR) 20 MG tablet Take 20 mg by mouth daily.     calcium citrate-vitamin D (CITRACAL+D) 315-200 MG-UNIT tablet Take 1 tablet by mouth every other day.     diltiazem (CARDIZEM CD) 180 MG 24 hr capsule Take 240 mg by mouth daily.     empagliflozin (JARDIANCE) 25 MG TABS tablet Take by mouth daily.     glipiZIDE (GLUCOTROL) 10 MG tablet Take 10 mg by mouth daily.     levothyroxine (SYNTHROID, LEVOTHROID) 100 MCG tablet Take 100 mcg by mouth daily before breakfast.     lisinopril (ZESTRIL) 10 MG tablet Take 10 mg by mouth daily.     metFORMIN (GLUCOPHAGE-XR) 750 MG 24 hr tablet Take 750 mg by mouth 2 (two) times daily.     Olopatadine HCl (PATADAY OP) Place 1 drop into both eyes daily as needed (Allergies).     omeprazole (PRILOSEC) 20 MG capsule Take 10 mg by mouth as needed.     rivaroxaban (XARELTO) 20 MG TABS tablet Take 20 mg by mouth daily with supper.     TRULICITY 3 MG/0.5ML SOPN Inject 3 mg into the skin once a week.     No current facility-administered medications for this visit.     Past Medical History:  Diagnosis Date   Diabetes    Type 2   Gallstones 03/07/2014   HLD (hyperlipidemia)    Hypertension    Hypothyroid    PE (pulmonary embolism) 8 2010   Pneumonia    PONV (postoperative nausea and vomiting)    nausea   Pregnancy     Past Surgical History:  Procedure  Laterality Date   CHOLECYSTECTOMY  10/18/2014   Procedure: LAPAROSCOPIC CHOLECYSTECTOMY WITH INTRAOPERATIVE CHOLANGIOGRAM;  Surgeon: Harriette Bouillonhomas Cornett, MD;  Location: [facility] OR;  Service: General;;   COLONOSCOPY W/ POLYPECTOMY     SKIN CANCER EXCISION     nose   TUMOR EXCISION Right    Fatty tumor removed from right breast   TUMOR REMOVAL Right    cheek    Social History   Socioeconomic History   Marital status: Widowed    Spouse name: Not on file   Number of children: 4   Years of education: Not on file   Highest education level: Not on file  Occupational History   Not on file  Tobacco Use   Smoking status: Never   Smokeless tobacco: Not on file  Substance and Sexual Activity   Alcohol use: No    Alcohol/week: 0.0 standard drinks of alcohol   Drug use: No   Sexual activity: Not on file  Other Topics Concern   Not on file  Social History Narrative   Not on file   Social Determinants of Health   Financial Resource Strain: Not on file  Food Insecurity: No Food Insecurity (01/14/2022)   Hunger Vital Sign    Worried About Running Out of Food in the Last Year: Never true  Ran Out of Food in the Last Year: Never true  Transportation Needs: No Transportation Needs (01/14/2022)   PRAPARE - Administrator, Civil Service (Medical): No    Lack of Transportation (Non-Medical): No  Physical Activity: Not on file  Stress: Not on file  Social Connections: Not on file  Intimate Partner Violence: Not on file    Family History  Problem Relation Age of Onset   Heart failure Mother    Heart failure Father     ROS: no fevers or chills, productive cough, hemoptysis, dysphasia, odynophagia, melena, hematochezia, dysuria, hematuria, rash, seizure activity, orthopnea, PND, pedal edema, claudication. Remaining systems are negative.  Physical Exam: Well-developed well-nourished in no acute distress.  Skin is warm and dry.  HEENT is normal.  Neck is supple.  Chest is clear to  auscultation with normal expansion.  Cardiovascular exam is regular rate and rhythm.  Abdominal exam nontender or distended. No masses palpated. Extremities show no edema. neuro grossly intact  ECG-normal sinus rhythm at a rate of 87, left anterior fascicular block.  Personally reviewed  A/P  1 hypertension-blood pressure is controlled.  Continue present medications.  Note she has had some dizziness which has improved with lower dose lisinopril.  If the symptoms worsen we will decrease further.  2 prior pulmonary embolus-continue Xarelto as she has had recurrences previously.  3 hyperlipidemia-continue statin.  4 coronary calcification-mild on previous calcium score.  Continue statin.  She is not having chest pain.  Olga Millers, MD

## 2022-04-29 ENCOUNTER — Encounter: Payer: Self-pay | Admitting: Cardiology

## 2022-04-29 ENCOUNTER — Ambulatory Visit: Payer: Medicare PPO | Attending: Cardiology | Admitting: Cardiology

## 2022-04-29 VITALS — BP 132/62 | HR 87 | Ht 69.5 in | Wt 213.0 lb

## 2022-04-29 DIAGNOSIS — I2510 Atherosclerotic heart disease of native coronary artery without angina pectoris: Secondary | ICD-10-CM

## 2022-04-29 DIAGNOSIS — E7800 Pure hypercholesterolemia, unspecified: Secondary | ICD-10-CM | POA: Diagnosis not present

## 2022-04-29 DIAGNOSIS — I10 Essential (primary) hypertension: Secondary | ICD-10-CM | POA: Diagnosis not present

## 2022-04-29 NOTE — Patient Instructions (Signed)
  Follow-Up: At [facility], you and your health needs are our priority.  As part of our continuing mission to provide you with exceptional heart care, we have created designated Provider Care Teams.  These Care Teams include your primary Cardiologist (physician) and Advanced Practice Providers (APPs -  Physician Assistants and Nurse Practitioners) who all work together to provide you with the care you need, when you need it.  We recommend signing up for the patient portal called "MyChart".  Sign up information is provided on this After Visit Summary.  MyChart is used to connect with patients for Virtual Visits (Telemedicine).  Patients are able to view lab/test results, encounter notes, upcoming appointments, etc.  Non-urgent messages can be sent to your provider as well.   To learn more about what you can do with MyChart, go to [email or web address].    Your next appointment:   12 month(s)  Provider:   Brian Crenshaw MD    

## 2022-05-30 DIAGNOSIS — E1165 Type 2 diabetes mellitus with hyperglycemia: Secondary | ICD-10-CM | POA: Diagnosis not present

## 2022-06-14 ENCOUNTER — Other Ambulatory Visit (HOSPITAL_COMMUNITY): Payer: Self-pay

## 2022-06-14 MED ORDER — TRULICITY 1.5 MG/0.5ML ~~LOC~~ SOAJ
SUBCUTANEOUS | 0 refills | Status: AC
  Filled 2022-06-14: qty 2, 28d supply, fill #0
  Filled 2022-07-05: qty 2, 28d supply, fill #1

## 2022-06-19 DIAGNOSIS — D2262 Melanocytic nevi of left upper limb, including shoulder: Secondary | ICD-10-CM | POA: Diagnosis not present

## 2022-06-19 DIAGNOSIS — D485 Neoplasm of uncertain behavior of skin: Secondary | ICD-10-CM | POA: Diagnosis not present

## 2022-06-19 DIAGNOSIS — Z86018 Personal history of other benign neoplasm: Secondary | ICD-10-CM | POA: Diagnosis not present

## 2022-06-19 DIAGNOSIS — L578 Other skin changes due to chronic exposure to nonionizing radiation: Secondary | ICD-10-CM | POA: Diagnosis not present

## 2022-06-19 DIAGNOSIS — L814 Other melanin hyperpigmentation: Secondary | ICD-10-CM | POA: Diagnosis not present

## 2022-06-19 DIAGNOSIS — L821 Other seborrheic keratosis: Secondary | ICD-10-CM | POA: Diagnosis not present

## 2022-06-19 DIAGNOSIS — D225 Melanocytic nevi of trunk: Secondary | ICD-10-CM | POA: Diagnosis not present

## 2022-06-19 DIAGNOSIS — Z85828 Personal history of other malignant neoplasm of skin: Secondary | ICD-10-CM | POA: Diagnosis not present

## 2022-06-19 DIAGNOSIS — D2261 Melanocytic nevi of right upper limb, including shoulder: Secondary | ICD-10-CM | POA: Diagnosis not present

## 2022-06-19 DIAGNOSIS — Z86006 Personal history of melanoma in-situ: Secondary | ICD-10-CM | POA: Diagnosis not present

## 2022-06-22 IMAGING — CT CT ANGIO CHEST
2 of 6 series · 18 of 46 positions shown · IV contrast (agent unspecified)
Comparison: Coronary calcium scoring study done on 11/20/2020

CLINICAL DATA: Near-syncope

EXAM:
CT ANGIOGRAPHY CHEST WITH CONTRAST
TECHNIQUE: Multidetector CT imaging of the chest was performed using the
standard protocol during bolus administration of intravenous
contrast. Multiplanar CT image reconstructions and MIPs were
obtained to evaluate the vascular anatomy.

[Series 4: thins · axial · 0.96mm/px · z∈[+1084,+1383]mm · 15 of 329 slices shown]
[im 15/329  lung]
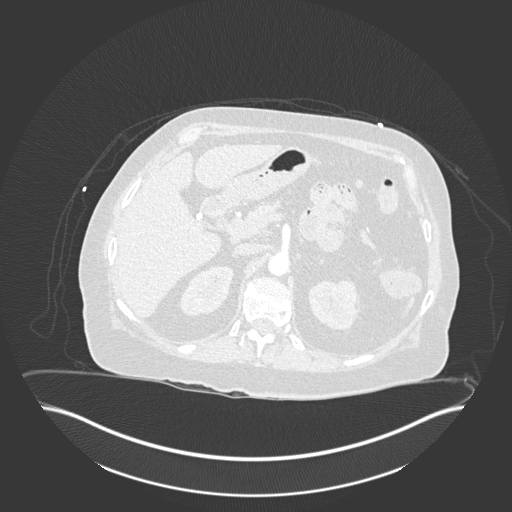
[im 43/329  soft-tissue]
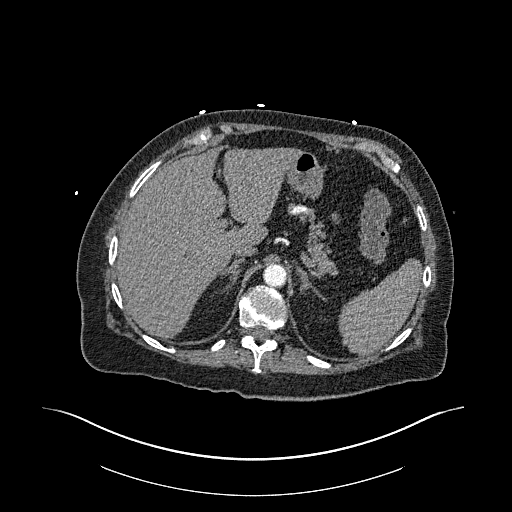
[im 58/329  lung]
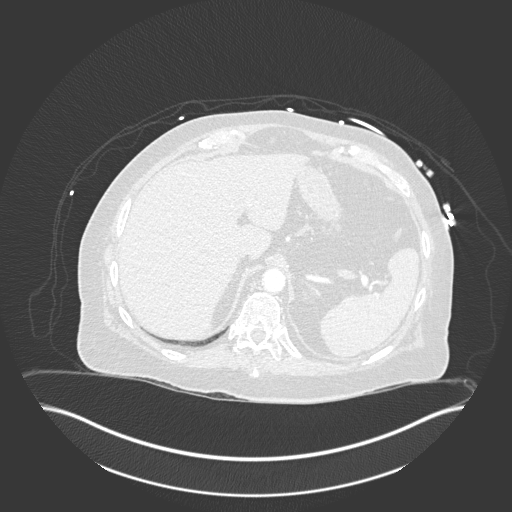
[im 86/329  soft-tissue]
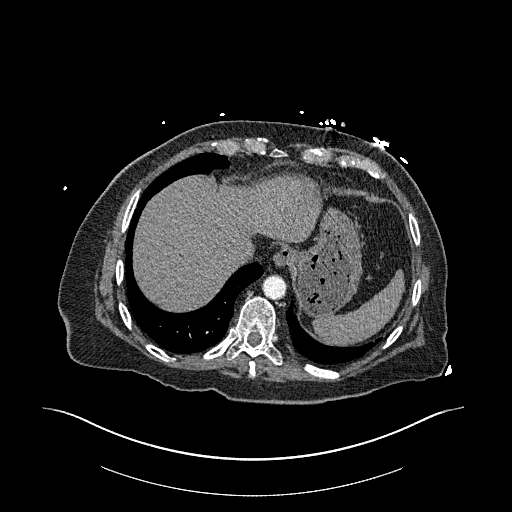
[im 100/329  lung]
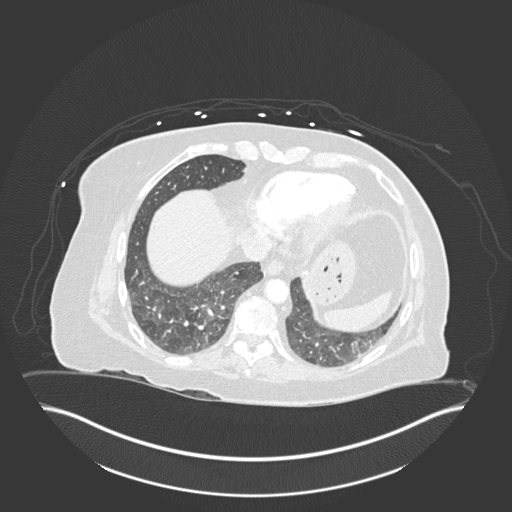
[im 129/329  soft-tissue]
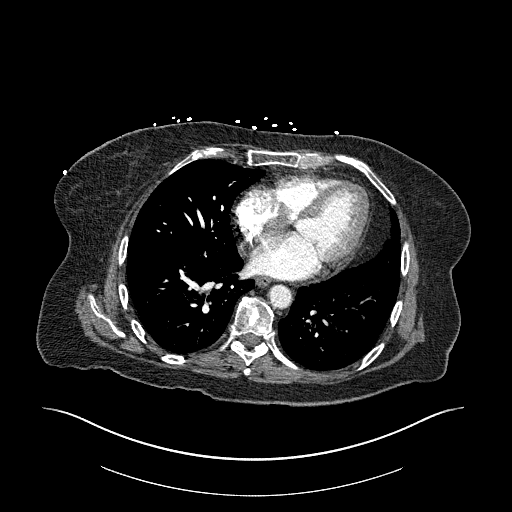
[im 143/329  lung]
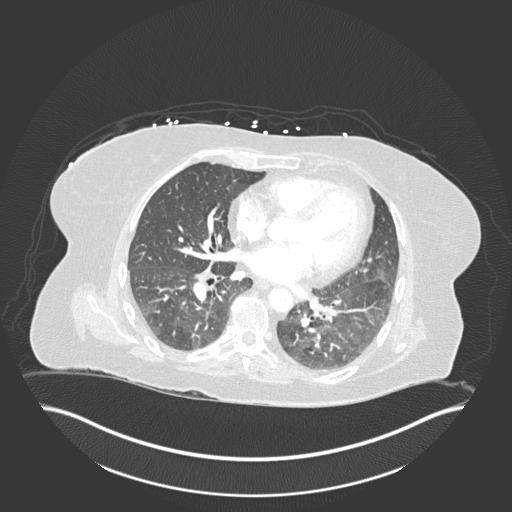
[im 172/329  soft-tissue]
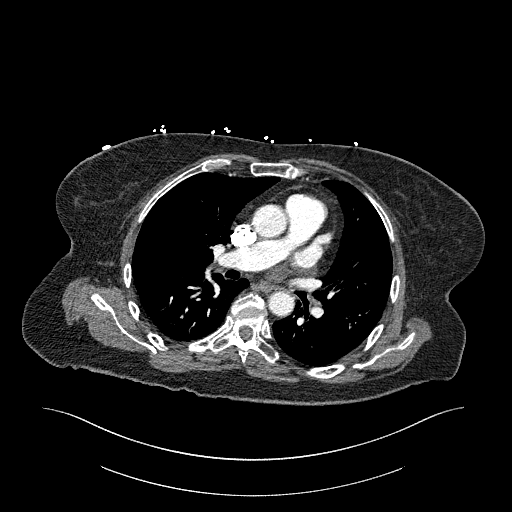
[im 186/329  lung]
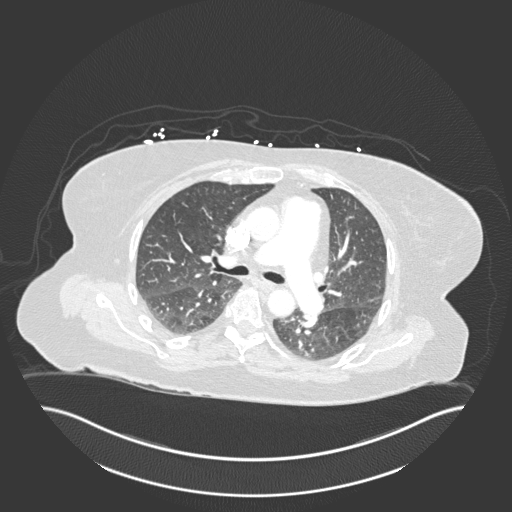
[im 200/329  soft-tissue]
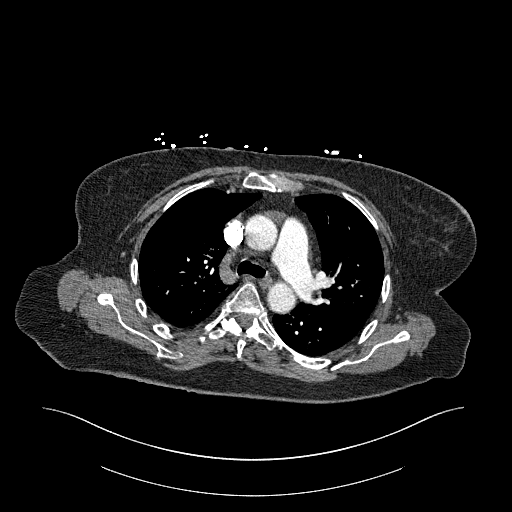
[im 229/329  lung]
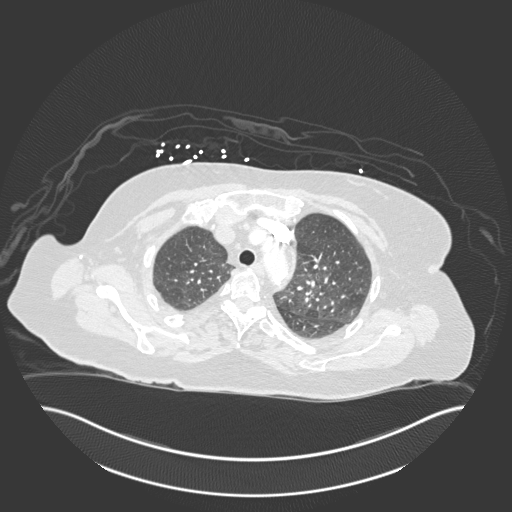
[im 243/329  soft-tissue]
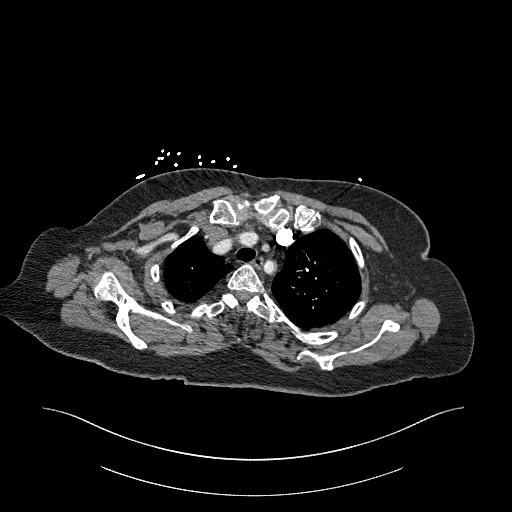
[im 271/329  lung]
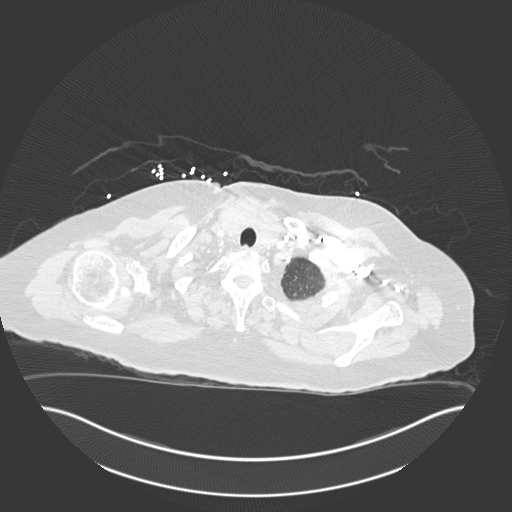
[im 286/329  soft-tissue]
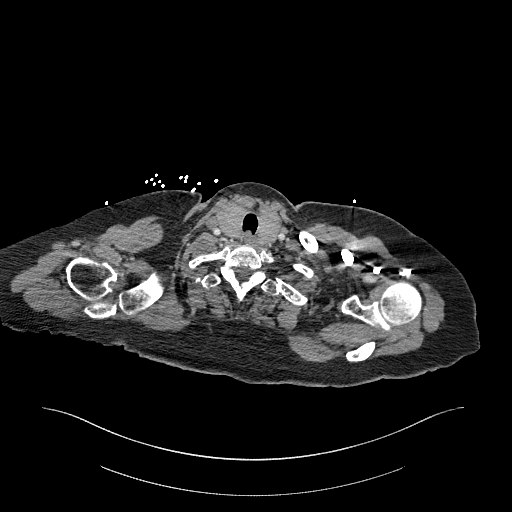
[im 314/329  lung]
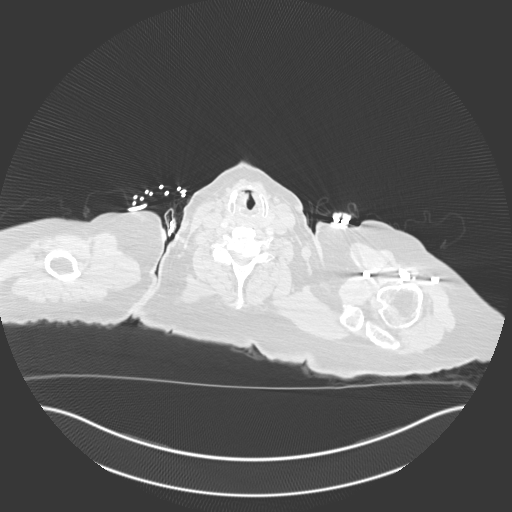

[Series 6: coronal mpr · coronal · 0.64mm/px · 3 of 151 slices shown]
[im 38/151  soft-tissue]
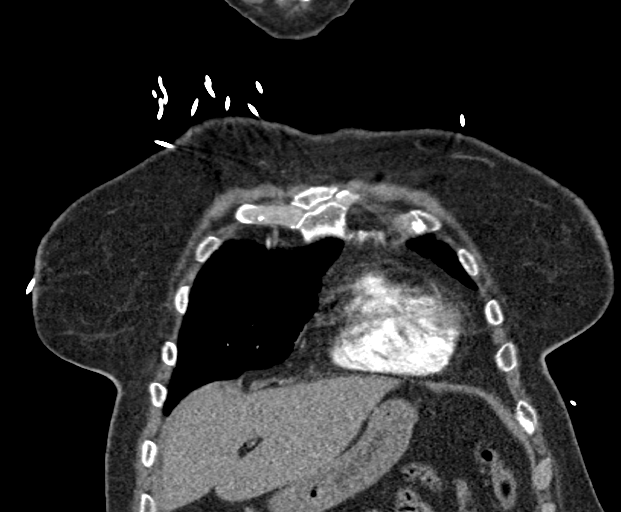
[im 76/151  soft-tissue]
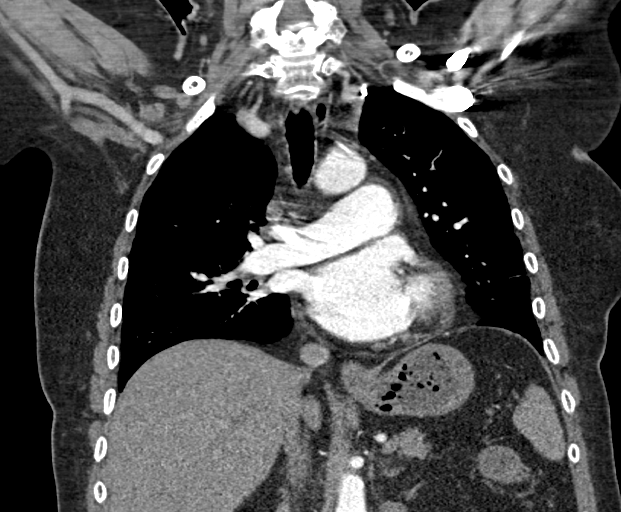
[im 113/151  soft-tissue]
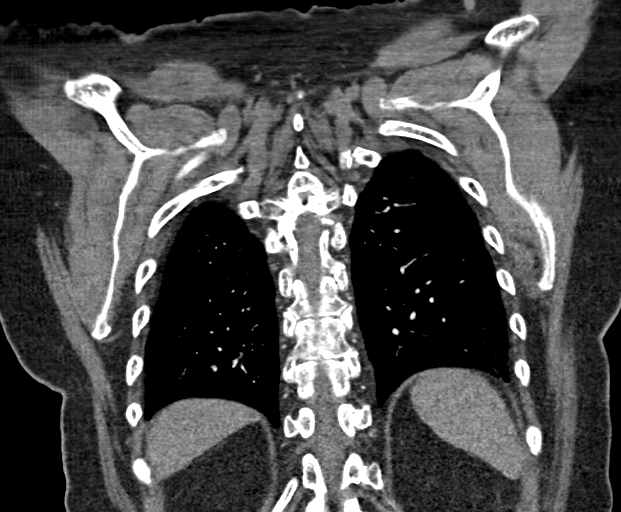

[18 of 46 positions shown; findings below may reference images not displayed]

RADIATION DOSE REDUCTION: This exam was performed according to the
departmental dose-optimization program which includes automated
exposure control, adjustment of the mA and/or kV according to
patient size and/or use of iterative reconstruction technique.

CONTRAST:  70mL OMNIPAQUE IOHEXOL 350 MG/ML SOLN
FINDINGS: Cardiovascular: There is homogeneous enhancement in thoracic aorta.
There are no intraluminal filling defects in central pulmonary
artery branches. Motion artifacts limit evaluation of small
peripheral pulmonary artery branches. There is ectasia of main
pulmonary artery measuring 3.5 cm.

Mediastinum/Nodes: No significant lymphadenopathy seen. Thyroid is
enlarged in size, more so in the right lobe.

Lungs/Pleura: There is no focal consolidation. There are faint
ground-glass densities in the mid and lower lung fields. There is no
pleural effusion or pneumothorax.

Upper Abdomen: There is fatty infiltration in the liver. Surgical
clips are seen in gallbladder fossa.

Musculoskeletal: Degenerative changes are noted in the visualized
lower cervical spine with encroachment of neural foramina.

Review of the MIP images confirms the above findings.
IMPRESSION: There is no evidence of central pulmonary artery embolism. There is
ectasia of main pulmonary artery suggesting pulmonary arterial
hypertension. There is no evidence of thoracic aortic dissection.

Faint ground-glass densities seen in the mid and lower lung fields
suggesting possible interstitial pneumonia. There is no focal
pulmonary consolidation. There is no pleural effusion or
pneumothorax.

Cervical spondylosis.  Fatty liver.

## 2022-09-02 DIAGNOSIS — Z7901 Long term (current) use of anticoagulants: Secondary | ICD-10-CM | POA: Diagnosis not present

## 2022-09-02 DIAGNOSIS — E039 Hypothyroidism, unspecified: Secondary | ICD-10-CM | POA: Diagnosis not present

## 2022-09-02 DIAGNOSIS — I10 Essential (primary) hypertension: Secondary | ICD-10-CM | POA: Diagnosis not present

## 2022-09-02 DIAGNOSIS — E119 Type 2 diabetes mellitus without complications: Secondary | ICD-10-CM | POA: Diagnosis not present
